# Patient Record
Sex: Male | Born: 1990 | Race: White | Hispanic: No | Marital: Single | Smoking: Current some day smoker
Health system: Southern US, Community
[De-identification: ages and names within clinical notes are randomized; demographics above are authoritative.]

## PROBLEM LIST (undated history)

## (undated) DIAGNOSIS — K219 Gastro-esophageal reflux disease without esophagitis: Secondary | ICD-10-CM

## (undated) DIAGNOSIS — B9562 Methicillin resistant Staphylococcus aureus infection as the cause of diseases classified elsewhere: Secondary | ICD-10-CM

## (undated) HISTORY — PX: FRACTURE SURGERY: SHX138

## (undated) HISTORY — PX: KNEE SURGERY: SHX244

---
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
## 2010-08-06 ENCOUNTER — Emergency Department: Payer: Self-pay | Admitting: Emergency Medicine

## 2010-11-28 ENCOUNTER — Emergency Department: Payer: Self-pay | Admitting: Emergency Medicine

## 2011-01-26 ENCOUNTER — Emergency Department: Payer: Self-pay | Admitting: Emergency Medicine

## 2012-07-10 ENCOUNTER — Emergency Department: Payer: Self-pay | Admitting: Emergency Medicine

## 2012-07-10 LAB — BASIC METABOLIC PANEL
Anion Gap: 10 (ref 7–16)
Chloride: 108 mmol/L — ABNORMAL HIGH (ref 98–107)
Creatinine: 0.83 mg/dL (ref 0.60–1.30)
EGFR (Non-African Amer.): >60
Osmolality: 280 (ref 275–301)

## 2012-07-10 LAB — CBC
HCT: 46 % (ref 40.0–52.0)
MCH: 31.1 pg (ref 26.0–34.0)
MCHC: 34.3 g/dL (ref 32.0–36.0)
MCV: 91 fL (ref 80–100)
Platelet: 219 10*3/uL (ref 150–440)
RDW: 13.1 % (ref 11.5–14.5)
WBC: 14.2 10*3/uL — ABNORMAL HIGH (ref 3.8–10.6)

## 2012-07-10 LAB — ETHANOL
Ethanol %: 0.107 % — ABNORMAL HIGH (ref 0.000–0.080)
Ethanol: 107 mg/dL

## 2012-07-10 LAB — CK TOTAL AND CKMB (NOT AT ARMC): CK, Total: 328 U/L — ABNORMAL HIGH (ref 35–232)

## 2012-10-31 ENCOUNTER — Emergency Department: Payer: Self-pay | Admitting: Emergency Medicine

## 2013-03-06 ENCOUNTER — Emergency Department: Payer: Self-pay | Admitting: Emergency Medicine

## 2013-07-10 IMAGING — CR RIGHT GREAT TOE
1 series · 3 of 3 positions shown · non-contrast
Comparison: none

REASON FOR EXAM: injury
COMMENTS:   LMP: (Male)

[Series 1: x toes ap right · 0.14mm/px · 3 of 3 slices shown]
[im 1/3]
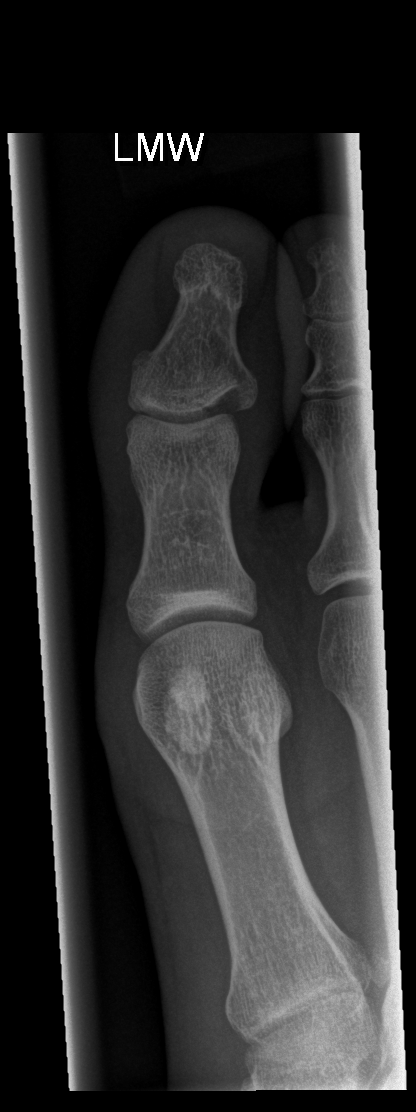
[im 2/3]
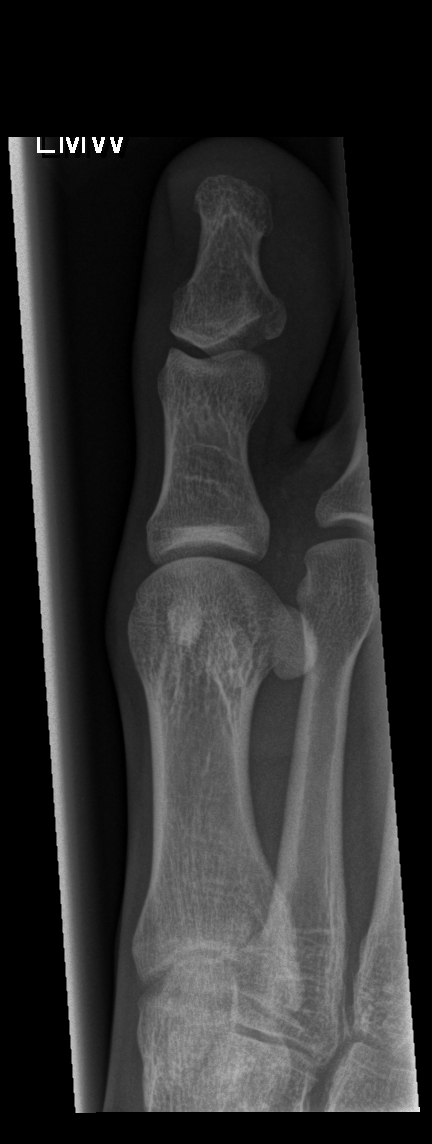
[im 3/3]
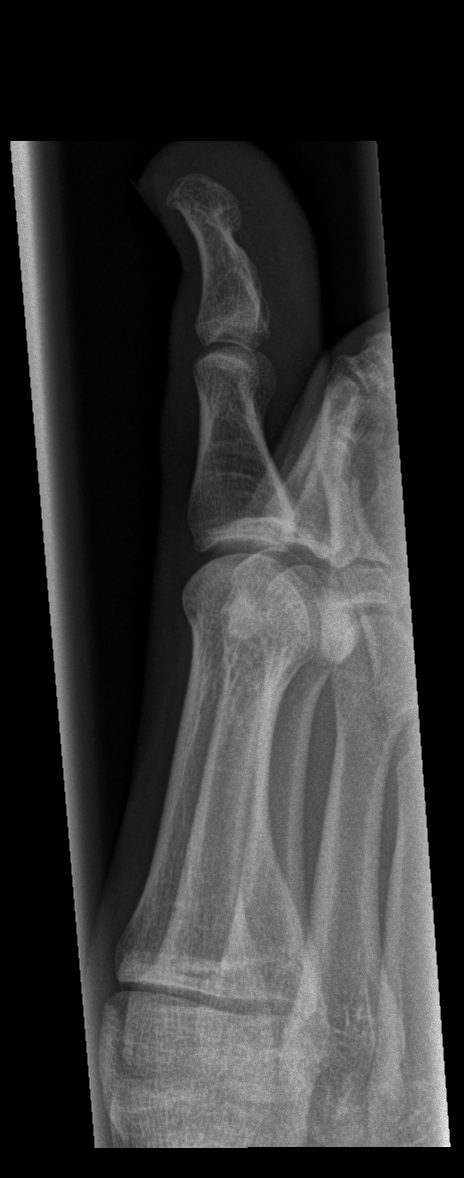

[3 of 3 positions shown; findings below may reference images not displayed]

PROCEDURE:     DXR - DXR TOE GREAT (1ST DIGIT) RT MUNAWARA  - July 10, 2012 [DATE]

RESULT:     Images of the right great toe show deformity at the lateral base
of the distal phalanx. Findings are concerning for a nondisplaced fracture.
The proximal phalanx appears unremarkable. The first metatarsal head and
neck appear to be within normal limits.
IMPRESSION: Please see above.

[REDACTED]

## 2018-01-04 ENCOUNTER — Emergency Department
Admission: EM | Admit: 2018-01-04 | Discharge: 2018-01-04 | Disposition: A | Discharge status: Home / Self Care | Payer: Self-pay | Attending: Emergency Medicine | Admitting: Emergency Medicine

## 2018-01-04 ENCOUNTER — Other Ambulatory Visit: Payer: Self-pay

## 2018-01-04 DIAGNOSIS — F17200 Nicotine dependence, unspecified, uncomplicated: Secondary | ICD-10-CM | POA: Insufficient documentation

## 2018-01-04 DIAGNOSIS — K047 Periapical abscess without sinus: Secondary | ICD-10-CM | POA: Insufficient documentation

## 2018-01-04 MED ORDER — AMOXICILLIN 500 MG PO CAPS: 500.0000 mg | ORAL_CAPSULE | Freq: Three times a day (TID) | ORAL | 0 refills | Status: DC

## 2018-01-04 MED ORDER — IBUPROFEN 600 MG PO TABS: 600.0000 mg | ORAL_TABLET | Freq: Four times a day (QID) | ORAL | 0 refills | Status: DC | PRN

## 2018-01-04 MED ORDER — LIDOCAINE VISCOUS HCL 2 % MT SOLN: 10.0000 mL | OROMUCOSAL | 0 refills | Status: DC | PRN

## 2018-01-04 NOTE — Discharge Instructions (Signed)
OPTIONS FOR DENTAL FOLLOW UP CARE ° °[place] [facility] and [facility] - Local Safety Net Dental Clinics °http://www.ncdhhs.gov/dph/oralhealth/services/safetynetclinics.htm °  °[facility] (336-562-3123) ° °[facility] [place] ([phone]) ° °[facility] ([phone] ext [phone]) ° °[facility] ([phone]) ° °[facility] ([phone]) °This clinic caters to the indigent population and is on a lottery system. °Location: °[facility], [address], [place] °Clinic Hours: °Wednesdays from 6pm - 9pm, patients seen by a lottery system. °For dates, call or go to [email or web address].[facility].edu/[facility]/patients/Dental-[facility] °Services: °Cleanings, fillings and simple extractions. °Payment Options: °DENTAL WORK IS FREE OF CHARGE. Bring proof of income or support. °Best way to get seen: °Arrive at 5:15 pm - this is a lottery, NOT first come/first serve, so arriving earlier will not increase your chances of being seen. °  °  °[facility] °[phone] °Select option 1 for emergencies °  °Location: °[facility], [address], [place] °Clinic Hours: °No walk-ins accepted - call the day before to schedule an appointment. °Check in times are 9:30 am and 1:30 pm. °Services: °Simple extractions, temporary fillings, pulpectomy/pulp debridement, uncomplicated abscess drainage. °Payment Options: °PAYMENT IS DUE AT THE TIME OF SERVICE.  Fee is usually $100-200, additional surgical procedures (e.g. abscess drainage) may be extra. °Cash, checks, Visa/MasterCard accepted.  Can file Medicaid if patient is covered for dental - patient should call case worker to check. °No discount for [facility] patients. °Best way to get seen: °MUST call the day before and get onto the schedule. Can usually be seen the next 1-2 days. No walk-ins accepted. °  °  °[facility] °[phone] °   °Location: °[facility], [address], [place] °Clinic Hours: °M, W, Th, F 8am or 1:30pm, Tues 9a or 1:30 - first come/first served. °Services: °Simple extractions, temporary fillings, uncomplicated abscess drainage.  You do not need to be an [place] resident. °Payment Options: °PAYMENT IS DUE AT THE TIME OF SERVICE. °Dental insurance, otherwise sliding scale - bring proof of income or support. °Depending on income and treatment needed, cost is usually $50-200. °Best way to get seen: °Arrive early as it is first come/first served. °  °  °[facility] °[phone] °  °Location: °[address] °Clinic Hours: °Mon-Thu 8a-5p °Services: °Most basic dental services including extractions and fillings. °Payment Options: °PAYMENT IS DUE AT THE TIME OF SERVICE. °Sliding scale, up to 50% off - bring proof if income or support. °Medicaid with dental option accepted. °Best way to get seen: °Call to schedule an appointment, can usually be seen within 2 weeks OR they will try to see walk-ins - show up at 8a or 2p (you may have to wait). °  °  °[facility] °[phone] °[place] RESIDENTS ONLY °  °Location: °[facility], [address], [place], [place] [address] °Clinic Hours: By appointment only. °Monday - Thursday 8am-5pm, Friday 8am-12pm °Services: Cleanings, fillings, extractions. °Payment Options: °PAYMENT IS DUE AT THE TIME OF SERVICE. °Cash, Visa or MasterCard. Sliding scale - $30 minimum per service. °Best way to get seen: °Come in to office, complete packet and make an appointment - need proof of income °or support monies for each household member and proof of [place] residence. °Usually takes about a month to get in. °  °  °[facility] °[phone] °  °Location: °[address]   [place] °Clinic Hours: Walk-in Urgent Care Dental Services are offered Monday-Friday  mornings only. °The numbers of emergencies accepted daily is limited to the number of °providers available. °Maximum 15 - Mondays, Wednesdays & Thursdays °Maximum 10 - Tuesdays & Fridays °Services: °You do not need to be a [place] resident to be seen for a dental emergency. °Emergencies are defined as pain, swelling, abnormal bleeding, or dental trauma. Walkins will receive x-rays if needed. °NOTE: Dental cleaning is not an emergency. °Payment Options: °PAYMENT IS DUE AT THE TIME OF SERVICE. °Minimum co-pay is $40.00 for uninsured patients. °Minimum co-pay is $3.00 for Medicaid with dental coverage. °Dental Insurance is accepted and must be presented at time of visit. °Medicare does not cover dental. °Forms of payment: Cash, credit card, checks. °Best way to get seen: °If not previously registered with the clinic, walk-in dental registration begins at 7:15 am and is on a first come/first serve basis. °If previously registered with the clinic, call to make an appointment. °  °  °[facility] °[phone] °[place] RESIDENTS ONLY °  °Location: °[address], [place], [place] °Clinic Hours: °Mon-Thu 10a-2p °Services: Extractions only! °Payment Options: °FREE (donations accepted) - bring proof of income or support °Best way to get seen: °Call and schedule an appointment OR come at 8am on the 1st Monday of every month (except for holidays) when it is first come/first served. °  °  °[facility] °[phone] °  °Location: °[address], [place] °Clinic Hours: °Friday mornings °Services, Payment Options, Best way to get seen: °Call for info °

## 2018-01-04 NOTE — ED Provider Notes (Signed)
[facility] Emergency Department Provider Note  ____________________________________________  Time seen: Approximately 9:39 AM  I have reviewed the triage vital signs and the nursing notes.   HISTORY  Chief Complaint Dental Pain    HPI Kurt Santos is a 27 y.o. male that presents to the emergency department for evaluation of right top dental pain for 2 days. This morning right cheek was swollen. He could taste blood this morning. He chipped tooth 8 months ago and saw the dentist and needs a procedure but insurance would not pay for it.  Hes tried ibuprofen, goody powders, orajel. No fever, chills.   History reviewed. No pertinent past medical history.  There are no active problems to display for this patient.   Past Surgical History:  Procedure Laterality Date  . FRACTURE SURGERY      Prior to Admission medications   Medication Sig Start Date End Date Taking? Authorizing Provider  amoxicillin (AMOXIL) 500 MG capsule Take 1 capsule (500 mg total) by mouth 3 (three) times daily. 01/04/18   Enid Derry, PA-C  ibuprofen (ADVIL,MOTRIN) 600 MG tablet Take 1 tablet (600 mg total) by mouth every 6 (six) hours as needed. 01/04/18   Enid Derry, PA-C  lidocaine (XYLOCAINE) 2 % solution Use as directed 10 mLs in the mouth or throat as needed for mouth pain. 01/04/18   Enid Derry, PA-C    Allergies Patient has no known allergies.  No family history on file.  Social History Social History   Tobacco Use  . Smoking status: Current Every Day Smoker  . Smokeless tobacco: Never Used  Substance Use Topics  . Alcohol use: Yes  . Drug use: Not on file     Review of Systems  Constitutional: No fever/chills ENT: No upper respiratory complaints. Gastrointestinal: No nausea, no vomiting.  Musculoskeletal: Negative for musculoskeletal pain. Skin: Negative for rash, abrasions, lacerations, ecchymosis. Neurological: Negative for  headaches   ____________________________________________   PHYSICAL EXAM:  VITAL SIGNS: ED Triage Vitals  Enc Vitals Group     BP 01/04/18 0830 (!) 116/57     Pulse Rate 01/04/18 0830 (!) 50     Resp 01/04/18 0830 16     Temp 01/04/18 0830 98.7 F (37.1 C)     Temp Source 01/04/18 0830 Oral     SpO2 01/04/18 0830 98 %     Weight 01/04/18 0826 130 lb (59 kg)     Height 01/04/18 0826 5\' 8"  (1.727 m)     Head Circumference --      Peak Flow --      Pain Score 01/04/18 0826 8     Pain Loc --      Pain Edu? --      Excl. in GC? --      Constitutional: Alert and oriented. Well appearing and in no acute distress. Eyes: Conjunctivae are normal. PERRL. EOMI. Head: Atraumatic. ENT:      Ears:      Nose: No congestion/rhinnorhea.      Mouth/Throat: Mucous membranes are moist. Large fracture and cavity to tooth # 1. Swelling to right cheek. No drainable abscess. No difficulty opening or closing mouth.   Neck: No stridor.  Cardiovascular: Good peripheral circulation. Respiratory: Normal respiratory effort without tachypnea or retractions. Musculoskeletal: Full range of motion to all extremities. No gross deformities appreciated. Neurologic:  Normal speech and language. No gross focal neurologic deficits are appreciated.  Skin:  Skin is warm, dry and intact. No rash noted.  Psychiatric: Mood and affect are normal. Speech and behavior are normal. Patient exhibits appropriate insight and judgement.   ____________________________________________   LABS (all labs ordered are listed, but only abnormal results are displayed)  Labs Reviewed - No data to display ____________________________________________  EKG   ____________________________________________  RADIOLOGY  No results found.  ____________________________________________    PROCEDURES  Procedure(s) performed:    Procedures    Medications - No data to  display   ____________________________________________   INITIAL IMPRESSION / ASSESSMENT AND PLAN / ED COURSE  Pertinent labs & imaging results that were available during my care of the patient were reviewed by me and considered in my medical decision making (see chart for details).  Review of the [place] CSRS was performed in accordance of the [facility] prior to dispensing any controlled drugs.     Patient's diagnosis is consistent with dental abscess. Patient will be discharged home with prescriptions for amoxicillin, viscous lidocaine. Patient is to follow up with dentist as directed. Dental resources were given. Patient is given ED precautions to return to the ED for any worsening or new symptoms.     ____________________________________________  FINAL CLINICAL IMPRESSION(S) / ED DIAGNOSES  Final diagnoses:  Dental abscess      NEW MEDICATIONS STARTED DURING THIS VISIT:  ED Discharge Orders        Ordered    amoxicillin (AMOXIL) 500 MG capsule  3 times daily     01/04/18 0944    lidocaine (XYLOCAINE) 2 % solution  As needed     01/04/18 0944    ibuprofen (ADVIL,MOTRIN) 600 MG tablet  Every 6 hours PRN     01/04/18 0944          This chart was dictated using voice recognition software/Dragon. Despite best efforts to proofread, errors can occur which can change the meaning. Any change was purely unintentional.    Enid DerryWagner, Ansleigh Safer, PA-C 01/04/18 1150    Pershing ProudSchaevitz, Myra Rudeavid Matthew, MD 01/04/18 [id number]

## 2018-01-04 NOTE — ED Triage Notes (Signed)
Pt c/o right upper tooth pain with mild facial swelling for the past 2 days.

## 2018-01-04 NOTE — ED Notes (Signed)

## 2019-11-30 ENCOUNTER — Encounter: Payer: Self-pay | Admitting: Emergency Medicine

## 2019-11-30 ENCOUNTER — Other Ambulatory Visit: Payer: Self-pay

## 2019-11-30 ENCOUNTER — Emergency Department
Admission: EM | Admit: 2019-11-30 | Discharge: 2019-11-30 | Disposition: A | Discharge status: Home / Self Care | Payer: Self-pay | Attending: Emergency Medicine | Admitting: Emergency Medicine

## 2019-11-30 DIAGNOSIS — W57XXXA Bitten or stung by nonvenomous insect and other nonvenomous arthropods, initial encounter: Secondary | ICD-10-CM | POA: Insufficient documentation

## 2019-11-30 DIAGNOSIS — F17210 Nicotine dependence, cigarettes, uncomplicated: Secondary | ICD-10-CM | POA: Insufficient documentation

## 2019-11-30 DIAGNOSIS — Y929 Unspecified place or not applicable: Secondary | ICD-10-CM | POA: Insufficient documentation

## 2019-11-30 DIAGNOSIS — S60561A Insect bite (nonvenomous) of right hand, initial encounter: Secondary | ICD-10-CM | POA: Insufficient documentation

## 2019-11-30 DIAGNOSIS — Y939 Activity, unspecified: Secondary | ICD-10-CM | POA: Insufficient documentation

## 2019-11-30 DIAGNOSIS — Y999 Unspecified external cause status: Secondary | ICD-10-CM | POA: Insufficient documentation

## 2019-11-30 MED ORDER — SULFAMETHOXAZOLE-TRIMETHOPRIM 800-160 MG PO TABS
1.0000 | ORAL_TABLET | Freq: Once | ORAL | Status: AC
  Administered 2019-11-30: 1 via ORAL
  Filled 2019-11-30: qty 1

## 2019-11-30 MED ORDER — SULFAMETHOXAZOLE-TRIMETHOPRIM 800-160 MG PO TABS: 1.0000 | ORAL_TABLET | Freq: Two times a day (BID) | ORAL | 0 refills | Status: DC

## 2019-11-30 MED ORDER — NAPROXEN 500 MG PO TABS: 500.0000 mg | ORAL_TABLET | Freq: Two times a day (BID) | ORAL | 0 refills | Status: DC

## 2019-11-30 MED ORDER — NAPROXEN 500 MG PO TABS
500.0000 mg | ORAL_TABLET | Freq: Once | ORAL | Status: AC
  Administered 2019-11-30: 500 mg via ORAL
  Filled 2019-11-30: qty 1

## 2019-11-30 NOTE — ED Provider Notes (Signed)
[facility] Emergency Department Provider Note   ____________________________________________   First MD Initiated Contact with Patient 11/30/19 1127     (approximate)  I have reviewed the triage vital signs and the nursing notes.   HISTORY  Chief Complaint Insect Bite    HPI Kurt Santos is a 29 y.o. male patient presents with insect bite to the right hand 2 days ago.  Patient states increased redness and pain today.  Patient denies loss sensation but states decreased grip strength.  Patient rates the pain as 8/10.  Patient described the pain is "achy".  No palliative measure for complaint.         History reviewed. No pertinent past medical history.  There are no problems to display for this patient.   Past Surgical History:  Procedure Laterality Date  . FRACTURE SURGERY      Prior to Admission medications   Medication Sig Start Date End Date Taking? Authorizing Provider  naproxen (NAPROSYN) 500 MG tablet Take 1 tablet (500 mg total) by mouth 2 (two) times daily with a meal. 11/30/19   Joni Reining, PA-C  sulfamethoxazole-trimethoprim (BACTRIM DS) 800-160 MG tablet Take 1 tablet by mouth 2 (two) times daily. 11/30/19   Joni Reining, PA-C    Allergies Patient has no known allergies.  No family history on file.  Social History Social History   Tobacco Use  . Smoking status: Current Every Day Smoker  . Smokeless tobacco: Never Used  Substance Use Topics  . Alcohol use: Yes  . Drug use: Not on file    Review of Systems Constitutional: No fever/chills Eyes: No visual changes. ENT: No sore throat. Cardiovascular: Denies chest pain. Respiratory: Denies shortness of breath. Gastrointestinal: No abdominal pain.  No nausea, no vomiting.  No diarrhea.  No constipation. Genitourinary: Negative for dysuria. Musculoskeletal: Negative for back pain. Skin: Negative for rash.  Redness and swelling dorsal aspect of the right  hand. Neurological: Negative for headaches, focal weakness or numbness.   ____________________________________________   PHYSICAL EXAM:  VITAL SIGNS: ED Triage Vitals  Enc Vitals Group     BP 11/30/19 1129 130/78     Pulse Rate 11/30/19 1129 88     Resp 11/30/19 1129 18     Temp 11/30/19 1129 98 F (36.7 C)     Temp Source 11/30/19 1129 Oral     SpO2 11/30/19 1129 98 %     Weight 11/30/19 1123 128 lb (58.1 kg)     Height 11/30/19 1123 5\' 8"  (1.727 m)     Head Circumference --      Peak Flow --      Pain Score 11/30/19 1122 8     Pain Loc --      Pain Edu? --      Excl. in GC? --    Constitutional: Alert and oriented. Well appearing and in no acute distress. Hematological/Lymphatic/Immunilogical: No cervical lymphadenopathy. Cardiovascular: Normal rate, regular rhythm. Grossly normal heart sounds.  Good peripheral circulation. Respiratory: Normal respiratory effort.  No retractions. Lungs CTAB. Skin: Edema and erythema dorsal aspect the right hand. Psychiatric: Mood and affect are normal. Speech and behavior are normal.  ____________________________________________   LABS (all labs ordered are listed, but only abnormal results are displayed)  Labs Reviewed - No data to display ____________________________________________  EKG   ____________________________________________  RADIOLOGY  ED MD interpretation:    Official radiology report(s): No results found.  ____________________________________________   PROCEDURES  Procedure(s)  performed (including Critical Care):  Procedures   ____________________________________________   INITIAL IMPRESSION / ASSESSMENT AND PLAN / ED COURSE  As part of my medical decision making, I reviewed the following data within the [facility]     Patient presents with redness and swelling to the right hand secondary to insect bite 2 days ago.  Patient's complaint and physical exam is consistent with  infected insect bite.  Patient given discharge care instruction advised take medication as directed.  Patient advised [facility] care with [facility].    Kurt Santos was evaluated in Emergency Department on 11/30/2019 for the symptoms described in the history of present illness. He was evaluated in the context of the global COVID-19 pandemic, which necessitated consideration that the patient might be at risk for infection with the SARS-CoV-2 virus that causes COVID-19. Institutional protocols and algorithms that pertain to the evaluation of patients at risk for COVID-19 are in a state of rapid change based on information released by regulatory bodies including the CDC and federal and state organizations. These policies and algorithms were followed during the patient's care in the ED.       ____________________________________________   FINAL CLINICAL IMPRESSION(S) / ED DIAGNOSES  Final diagnoses:  Bug bite with infection, initial encounter     ED Discharge Orders         Ordered    sulfamethoxazole-trimethoprim (BACTRIM DS) 800-160 MG tablet  2 times daily     Discontinue  Reprint     11/30/19 1139    naproxen (NAPROSYN) 500 MG tablet  2 times daily with meals     Discontinue  Reprint     11/30/19 1139           Note:  This document was prepared using Dragon voice recognition software and may include unintentional dictation errors.    Sable Feil, PA-C 11/30/19 1144    Arta Silence, MD 11/30/19 1158

## 2019-11-30 NOTE — ED Triage Notes (Signed)
Pt reports spider bite to right hand Sunday. Pt reports thought it would get better but still painful

## 2019-12-02 ENCOUNTER — Ambulatory Visit: Payer: Self-pay | Admitting: *Deleted

## 2019-12-02 NOTE — Telephone Encounter (Signed)
Pt seen in ED 11/30/2019 for spider bite on wrist. Given antibiotics, day 2 of starting course. States "Just doesn't look any better." Some increased tenderness, "Bite itself looks bigger and hardened area." No increase in swelling, no change in warmth of area or redness surrounding bite. .  Advised to give antibiotics a few more days, advised ED for any increase redness, warmth, pain, severe headache or fever.Pt applying ice. Pt verbalizes understanding.   Reason for Disposition . Painful insect bite  Answer Assessment - Initial Assessment Questions 1. TYPE of INSECT: "What type of insect was it?"      Spider 2. ONSET: "When did you get bitten?"       3. LOCATION: "Where is the insect bite located?"       4. REDNESS: "Is the area red or pink?" If so, ask "What size is area of redness?" (inches or cm). "When did the redness start?"     *No Answer* 5. PAIN: "Is there any pain?" If so, ask: "How bad is it?"  (Scale 1-10; or mild, moderate, severe)     *No Answer* 6. ITCHING: "Does it itch?" If so, ask: "How bad is the itch?"    - MILD: doesn't interfere with normal activities   - MODERATE-SEVERE: interferes with work, school, sleep, or other activities      *No Answer* 7. SWELLING: "How big is the swelling?" (inches, cm, or compare to coins)     *No Answer* 8. OTHER SYMPTOMS: "Do you have any other symptoms?"  (e.g., difficulty breathing, hives)     no  Protocols used: INSECT BITE-A-AH

## 2019-12-03 ENCOUNTER — Other Ambulatory Visit: Payer: Self-pay

## 2019-12-03 ENCOUNTER — Emergency Department: Payer: Self-pay

## 2019-12-03 ENCOUNTER — Emergency Department
Admission: EM | Admit: 2019-12-03 | Discharge: 2019-12-03 | Disposition: A | Discharge status: Home / Self Care | Payer: Self-pay | Attending: Emergency Medicine | Admitting: Emergency Medicine

## 2019-12-03 DIAGNOSIS — M71031 Abscess of bursa, right wrist: Secondary | ICD-10-CM

## 2019-12-03 DIAGNOSIS — L02413 Cutaneous abscess of right upper limb: Secondary | ICD-10-CM | POA: Insufficient documentation

## 2019-12-03 DIAGNOSIS — F17210 Nicotine dependence, cigarettes, uncomplicated: Secondary | ICD-10-CM | POA: Insufficient documentation

## 2019-12-03 DIAGNOSIS — W57XXXD Bitten or stung by nonvenomous insect and other nonvenomous arthropods, subsequent encounter: Secondary | ICD-10-CM | POA: Insufficient documentation

## 2019-12-03 LAB — CBC
HCT: 39.6 % (ref 39.0–52.0)
Hemoglobin: 13.6 g/dL (ref 13.0–17.0)
MCH: 30.8 pg (ref 26.0–34.0)
MCHC: 34.3 g/dL (ref 30.0–36.0)
MCV: 89.6 fL (ref 80.0–100.0)
Platelets: 202 10*3/uL (ref 150–400)
RBC: 4.42 MIL/uL (ref 4.22–5.81)
RDW: 12.7 % (ref 11.5–15.5)
WBC: 12.7 10*3/uL — ABNORMAL HIGH (ref 4.0–10.5)
nRBC: 0 % (ref 0.0–0.2)

## 2019-12-03 LAB — COMPREHENSIVE METABOLIC PANEL
ALT: 23 U/L (ref 0–44)
AST: 25 U/L (ref 15–41)
Albumin: 4.5 g/dL (ref 3.5–5.0)
Alkaline Phosphatase: 57 U/L (ref 38–126)
Anion gap: 6 (ref 5–15)
BUN: 12 mg/dL (ref 6–20)
CO2: 25 mmol/L (ref 22–32)
Calcium: 8.9 mg/dL (ref 8.9–10.3)
Chloride: 107 mmol/L (ref 98–111)
Creatinine, Ser: 1.24 mg/dL (ref 0.61–1.24)
GFR calc Af Amer: >60 mL/min (ref >60)
GFR calc non Af Amer: >60 mL/min (ref >60)
Glucose, Bld: 104 mg/dL — ABNORMAL HIGH (ref 70–99)
Potassium: 3.9 mmol/L (ref 3.5–5.1)
Sodium: 138 mmol/L (ref 135–145)
Total Bilirubin: 1 mg/dL (ref 0.3–1.2)
Total Protein: 7.6 g/dL (ref 6.5–8.1)

## 2019-12-03 MED ORDER — HYDROCODONE-ACETAMINOPHEN 5-325 MG PO TABS: 1.0000 | ORAL_TABLET | Freq: Four times a day (QID) | ORAL | 0 refills | Status: DC | PRN

## 2019-12-03 MED ORDER — CEPHALEXIN 500 MG PO CAPS: 500.0000 mg | ORAL_CAPSULE | Freq: Three times a day (TID) | ORAL | 0 refills | Status: DC

## 2019-12-03 MED ORDER — LIDOCAINE-EPINEPHRINE-TETRACAINE (LET) TOPICAL GEL
3.0000 mL | Freq: Once | TOPICAL | Status: AC
  Administered 2019-12-03: 3 mL via TOPICAL
  Filled 2019-12-03: qty 3

## 2019-12-03 MED ORDER — OXYCODONE-ACETAMINOPHEN 7.5-325 MG PO TABS
1.0000 | ORAL_TABLET | Freq: Once | ORAL | Status: AC
  Administered 2019-12-03: 1 via ORAL
  Filled 2019-12-03: qty 1

## 2019-12-03 MED ORDER — LIDOCAINE HCL (PF) 1 % IJ SOLN
5.0000 mL | Freq: Once | INTRAMUSCULAR | Status: AC
  Administered 2019-12-03: 5 mL
  Filled 2019-12-03: qty 5

## 2019-12-03 NOTE — Discharge Instructions (Signed)
Continue taking the antibiotic that you already have until completely finished.  A new antibiotic was sent to your pharmacy which should be taken at the same time 3 times a day for the next 7 days.  Also a prescription for pain medication was sent to your pharmacy to take as needed every 6 hours.  Also elevating your hand will help with pain.  You may return to the emergency department in 2 days if you would like for [place] to take the drain out.  Most likely will fall out on its own when you take the dressing off in 2 days.  If you see that it is worsening or not improving return to the emergency department.

## 2019-12-03 NOTE — ED Triage Notes (Signed)
Pt was bit by spider Saturday and started having redness and itching around the site. Pt was seen for the same and put on antibiotics for cellulitis. States woke up tonight with worsening swelling and redness. Area is to right wrist with swelling extending to hand.

## 2019-12-03 NOTE — ED Provider Notes (Signed)
[facility] Emergency Department Provider Note   ____________________________________________   First MD Initiated Contact with Patient 12/03/19 1057     (approximate)  I have reviewed the triage vital signs and the nursing notes.   HISTORY  Chief Complaint Cellulitis   HPI Kurt Santos is a 29 y.o. male Libby Maw to the ED with complaint of continued pain and swelling around his spider bite on his right wrist.  Patient was seen in the ED for the same on 11/30/2019.  Patient states that while he was working he was also clearing a "nest of spiders".  He was not aware that he had been bitten.  Patient has been taking the Bactrim DS as directed.  He states that it is slowly become more red and more painful in the last 1 to 2 days.  He denies any fever or chills.  There has been no nausea or vomiting.  He rates his pain as an 8 out of 10.       No past medical history on file.  There are no problems to display for this patient.   Past Surgical History:  Procedure Laterality Date  . FRACTURE SURGERY      Prior to Admission medications   Medication Sig Start Date End Date Taking? Authorizing Provider  cephALEXin (KEFLEX) 500 MG capsule Take 1 capsule (500 mg total) by mouth 3 (three) times daily. 12/03/19   Johnn Hai, PA-C  HYDROcodone-acetaminophen (NORCO/VICODIN) 5-325 MG tablet Take 1 tablet by mouth every 6 (six) hours as needed for moderate pain. 12/03/19   Johnn Hai, PA-C  naproxen (NAPROSYN) 500 MG tablet Take 1 tablet (500 mg total) by mouth 2 (two) times daily with a meal. 11/30/19   Sable Feil, PA-C  sulfamethoxazole-trimethoprim (BACTRIM DS) 800-160 MG tablet Take 1 tablet by mouth 2 (two) times daily. 11/30/19   Sable Feil, PA-C    Allergies Patient has no known allergies.  No family history on file.  Social History Social History   Tobacco Use  . Smoking status: Current Every Day Smoker  . Smokeless tobacco:  Never Used  Substance Use Topics  . Alcohol use: Yes  . Drug use: Not on file    Review of Systems Constitutional: No fever/chills Cardiovascular: Denies chest pain. Respiratory: Denies shortness of breath. Gastrointestinal: No abdominal pain.  No nausea, no vomiting.  Musculoskeletal: Positive right wrist pain. Skin: Positive abscess right wrist. Neurological: Negative for headaches, focal weakness or numbness.  ____________________________________________   PHYSICAL EXAM:  VITAL SIGNS: ED Triage Vitals  Enc Vitals Group     BP 12/03/19 0221 (!) 156/88     Pulse Rate 12/03/19 0221 86     Resp 12/03/19 0221 20     Temp 12/03/19 0221 98.8 F (37.1 C)     Temp Source 12/03/19 0221 Oral     SpO2 12/03/19 0221 100 %     Weight 12/03/19 0222 128 lb (58.1 kg)     Height 12/03/19 0222 5\' 8"  (1.727 m)     Head Circumference --      Peak Flow --      Pain Score 12/03/19 0222 8     Pain Loc --      Pain Edu? --      Excl. in [place]? --     Constitutional: Alert and oriented. Well appearing and in no acute distress. Eyes: Conjunctivae are normal.  Head: Atraumatic. Neck: No stridor.   Cardiovascular:  Normal rate, regular rhythm. Grossly normal heart sounds.  Good peripheral circulation. Respiratory: Normal respiratory effort.  No retractions. Lungs CTAB. Musculoskeletal: On examination of the the right wrist ulnar aspect there is a large erythematous area that is fluctuant and warm to touch.  Range of motion is slow but patient is able to flex and extend.  He also is able to move digits slowly secondary to pain.  Motor sensory function intact and capillary refills less than 3 seconds. Neurologic:  Normal speech and language. No gross focal neurologic deficits are appreciated. No gait instability. Skin:  Skin is warm, dry.  Abscess is noted above. Psychiatric: Mood and affect are normal. Speech and behavior are normal.  ____________________________________________   LABS (all  labs ordered are listed, but only abnormal results are displayed)  Labs Reviewed  CBC - Abnormal; Notable for the following components:      Result Value   WBC 12.7 (*)    All other components within normal limits  COMPREHENSIVE METABOLIC PANEL - Abnormal; Notable for the following components:   Glucose, Bld 104 (*)    All other components within normal limits   _  RADIOLOGY   Official radiology report(s): DG Wrist Complete Right  Result Date: 12/03/2019 CLINICAL DATA:  Infection EXAM: RIGHT WRIST - COMPLETE 3+ VIEW COMPARISON:  08/06/2010 FINDINGS: Soft tissue swelling along the ulnar side of the wrist. No radiopaque foreign bodies. No acute bony abnormality. Specifically, no fracture, subluxation, or dislocation. No evidence of osteomyelitis. IMPRESSION: Soft tissue swelling.  No bony abnormality. Electronically Signed   By: Charlett Nose M.D.   On: 12/03/2019 02:50    ____________________________________________   PROCEDURES  Procedure(s) performed (including Critical Care):  Marland KitchenMarland KitchenIncision and Drainage  Date/Time: 12/03/2019 11:50 AM Performed by: Tommi Rumps, PA-C Authorized by: Tommi Rumps, PA-C   Consent:    Consent obtained:  Verbal   Consent given by:  Patient   Risks discussed:  Incomplete drainage and infection Location:    Type:  Abscess   Location:  Upper extremity   Upper extremity location:  Wrist   Wrist location:  R wrist Pre-procedure details:    Skin preparation:  Antiseptic wash Anesthesia (see MAR for exact dosages):    Anesthesia method:  Topical application and local infiltration   Topical anesthetic:  LET   Local anesthetic:  Lidocaine 1% w/o epi Procedure type:    Complexity:  Simple Procedure details:    Needle aspiration: no     Incision types:  Stab incision   Incision depth:  Dermal   Scalpel blade:  11   Wound management:  Probed and deloculated and irrigated with saline   Drainage:  Purulent   Drainage amount:  Moderate    Wound treatment:  Drain placed   Packing materials:  1/4 in iodoform gauze Post-procedure details:    Patient tolerance of procedure:  Tolerated well, no immediate complications     ____________________________________________   INITIAL IMPRESSION / ASSESSMENT AND PLAN / ED COURSE  As part of my medical decision making, I reviewed the following data within the electronic MEDICAL RECORD NUMBER Notes from prior ED visits and [place] Controlled Substance Database  29 year old male presents to the ED with possible spider bite to his wrist.  He was seen in the emergency department 3 days ago at which time he was placed on Bactrim DS and he continues to take.  X-ray was negative for signs of osteomyelitis.  Patient tolerated procedure well and for the  size of the area there was moderate amount of purulent drainage.  A iodoform gauze packing was placed.  Patient is also to start Keflex 500 mg 3 times daily for the next 7 days and a prescription for pain medication was sent to his pharmacy.  He was told to return to the emergency department in 2 days if he is unable or has not seen the packing fall out on its own.  Also he is aware that if there is continued redness and not improving that he will field outpatient antibiotics with the I&D done today as where most likely he will be hospitalized for IV antibiotics.  He is encouraged to return to the emergency department over the weekend if any problems.  ____________________________________________   FINAL CLINICAL IMPRESSION(S) / ED DIAGNOSES  Final diagnoses:  Abscess of bursa of right wrist     ED Discharge Orders         Ordered    cephALEXin (KEFLEX) 500 MG capsule  3 times daily     Discontinue  Reprint     12/03/19 1240    HYDROcodone-acetaminophen (NORCO/VICODIN) 5-325 MG tablet  Every 6 hours PRN     Discontinue  Reprint     12/03/19 1240           Note:  This document was prepared using Dragon voice recognition software and may include  unintentional dictation errors.    Tommi Rumps, PA-C 12/03/19 1605    Shaune Pollack, MD 12/06/19 [phone]

## 2019-12-07 ENCOUNTER — Other Ambulatory Visit: Payer: Self-pay

## 2019-12-07 ENCOUNTER — Ambulatory Visit
Admission: RE | Admit: 2019-12-07 | Discharge: 2019-12-07 | Disposition: A | Discharge status: Home / Self Care | Payer: Self-pay | Source: Ambulatory Visit | Attending: Family Medicine | Admitting: Family Medicine

## 2019-12-07 VITALS — BP 103/59 | HR 58 | Temp 98.4°F | Resp 18 | Ht 68.0 in | Wt 128.1 lb

## 2019-12-07 DIAGNOSIS — L02419 Cutaneous abscess of limb, unspecified: Secondary | ICD-10-CM

## 2019-12-07 MED ORDER — DOXYCYCLINE HYCLATE 100 MG PO CAPS: 100.0000 mg | ORAL_CAPSULE | Freq: Two times a day (BID) | ORAL | 0 refills | Status: DC

## 2019-12-07 NOTE — Discharge Instructions (Signed)
Keep area clean.  Stop Bactrim and Keflex. Start Doxycycline.  Take care  Dr. Adriana Simas

## 2019-12-07 NOTE — ED Provider Notes (Signed)
[facility]    CSN: [id number] Arrival date & time: 12/07/19  1053 History   Chief Complaint Chief Complaint  Patient presents with  . Abscess  . work note   HPI  29 year old male presents with the above complaints.  Patient has had 2 visits to [facility] last week. Reviewed today.  6/15 - Reported insect bite.  Was evaluated and thought to be infected.  Was placed on Bactrim and naproxen.  6/18 - Presented with worsening.  Incision and drainage was performed.  Discharged on Keflex in addition to Bactrim.  Patient presents today reporting that he is unable to work due to his abscess (Location: Right wrist). He endorses compliance with his medications.  Rates his pain a 7/10 in severity.  No fever.  Patient requesting FMLA paperwork to be filled out today.  No other associated symptoms.  No other complaints.  Past Surgical History:  Procedure Laterality Date  . FRACTURE SURGERY     Home Medications    Prior to Admission medications   Medication Sig Start Date End Date Taking? Authorizing Provider  HYDROcodone-acetaminophen (NORCO/VICODIN) 5-325 MG tablet Take 1 tablet by mouth every 6 (six) hours as needed for moderate pain. 12/03/19  Yes Tommi Rumps, PA-C  naproxen (NAPROSYN) 500 MG tablet Take 1 tablet (500 mg total) by mouth 2 (two) times daily with a meal. 11/30/19  Yes Joni Reining, PA-C  doxycycline (VIBRAMYCIN) 100 MG capsule Take 1 capsule (100 mg total) by mouth 2 (two) times daily. 12/07/19   Tommie Sams, DO    Family History Family History  Problem Relation Age of Onset  . Healthy Mother   . Healthy Father     Social History Social History   Tobacco Use  . Smoking status: Current Every Day Smoker    Types: Cigarettes  . Smokeless tobacco: Never Used  Vaping Use  . Vaping Use: Never used  Substance Use Topics  . Alcohol use: Yes  . Drug use: Not Currently     Allergies   Patient has no known allergies.   Review of  Systems Review of Systems  Constitutional: Negative for fever.  Skin: Positive for wound.   Physical Exam Triage Vital Signs ED Triage Vitals  Enc Vitals Group     BP 12/07/19 1109 (!) 103/59     Pulse Rate 12/07/19 1109 (!) 58     Resp 12/07/19 1109 18     Temp 12/07/19 1109 98.4 F (36.9 C)     Temp Source 12/07/19 1109 Oral     SpO2 12/07/19 1109 98 %     Weight 12/07/19 1106 128 lb 1.4 oz (58.1 kg)     Height 12/07/19 1106 5\' 8"  (1.727 m)     Head Circumference --      Peak Flow --      Pain Score 12/07/19 1106 7     Pain Loc --      Pain Edu? --      Excl. in [place]? --    Updated Vital Signs BP (!) 103/59 (BP Location: Left Arm)   Pulse (!) 58   Temp 98.4 F (36.9 C) (Oral)   Resp 18   Ht 5\' 8"  (1.727 m)   Wt 58.1 kg   SpO2 98%   BMI 19.48 kg/m   Visual Acuity Right Eye Distance:   Left Eye Distance:   Bilateral Distance:    Right Eye Near:   Left Eye Near:  Bilateral Near:     Physical Exam Vitals and nursing note reviewed.  Constitutional:      Appearance: Normal appearance.  HENT:     Head: Normocephalic and atraumatic.  Eyes:     General:        Right eye: No discharge.        Left eye: No discharge.     Conjunctiva/sclera: Conjunctivae normal.  Pulmonary:     Effort: Pulmonary effort is normal. No respiratory distress.  Skin:    Comments: Right wrist -ulnar aspect with an open wound from recent incision and drainage.  Purulence noted.  Surrounding erythema and mild warmth noted as well.  Neurological:     Mental Status: He is alert.  Psychiatric:        Mood and Affect: Mood normal.        Behavior: Behavior normal.    UC Treatments / Results  Labs (all labs ordered are listed, but only abnormal results are displayed) Labs Reviewed - No data to display  EKG   Radiology No results found.  Procedures Procedures (including critical care time)  Medications Ordered in UC Medications - No data to display  Initial Impression /  Assessment and Plan / UC Course  I have reviewed the triage vital signs and the nursing notes.  Pertinent labs & imaging results that were available during my care of the patient were reviewed by me and considered in my medical decision making (see chart for details).    29 year old male presents with an abscess.  Has already been seen previously and has had incision and drainage.  Given his current clinical exam and the fact that he does not seem to be resolving, I am stopping his Bactrim and Keflex and placing him on doxycycline.  Work note given.  Final Clinical Impressions(s) / UC Diagnoses   Final diagnoses:  Abscess, wrist     Discharge Instructions     Keep area clean.  Stop Bactrim and Keflex. Start Doxycycline.  Take care  Dr. Lacinda Axon    ED Prescriptions    Medication Sig Dispense Auth. Provider   doxycycline (VIBRAMYCIN) 100 MG capsule Take 1 capsule (100 mg total) by mouth 2 (two) times daily. 20 capsule Coral Spikes, DO     PDMP not reviewed this encounter.   Coral Spikes, [place] 12/07/19 1323

## 2019-12-07 NOTE — ED Triage Notes (Addendum)
Pt c/o right wrist abscess from spider bite. He was seen at the ED on 12/03/19 and treated. 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

## 2019-12-07 NOTE — ED Notes (Signed)
Cover wound with non stick gauze and coban

## 2020-04-13 ENCOUNTER — Other Ambulatory Visit: Payer: Self-pay

## 2020-04-13 ENCOUNTER — Encounter: Payer: Self-pay | Admitting: Emergency Medicine

## 2020-04-13 ENCOUNTER — Ambulatory Visit
Admission: EM | Admit: 2020-04-13 | Discharge: 2020-04-13 | Disposition: A | Discharge status: Home / Self Care | Payer: BC Managed Care – PPO | Attending: Emergency Medicine | Admitting: Emergency Medicine

## 2020-04-13 DIAGNOSIS — S50861A Insect bite (nonvenomous) of right forearm, initial encounter: Secondary | ICD-10-CM | POA: Diagnosis not present

## 2020-04-13 DIAGNOSIS — W57XXXA Bitten or stung by nonvenomous insect and other nonvenomous arthropods, initial encounter: Secondary | ICD-10-CM

## 2020-04-13 DIAGNOSIS — L03113 Cellulitis of right upper limb: Secondary | ICD-10-CM | POA: Diagnosis not present

## 2020-04-13 MED ORDER — DOXYCYCLINE HYCLATE 100 MG PO CAPS: 100.0000 mg | ORAL_CAPSULE | Freq: Two times a day (BID) | ORAL | 0 refills | Status: AC

## 2020-04-13 NOTE — ED Triage Notes (Signed)
Patient c/o bite to his right elbow x 2 days ago.

## 2020-04-13 NOTE — ED Provider Notes (Signed)
[facility]    CSN: [id number] Arrival date & time: 04/13/20  1403      History   Chief Complaint Chief Complaint  Patient presents with   Insect Bite    HPI [name] is a 29 y.o. male.   28 year old male here for evaluation of a bite to his right forearm that he said for 2 days.  Patient states that he was fine when he went to bed 3 nights ago woke up in the morning 2 days ago and he had redness, swelling and a bite on his arm.  Patient endorses body aches, joint pain, headache, and muscle aches.  Patient denies fever.  Patient did not see what bit him.  Patient does work outside.  He does have a cat but it is an Art therapist.     History reviewed. No pertinent past medical history.  There are no problems to display for this patient.   Past Surgical History:  Procedure Laterality Date   FRACTURE SURGERY         Home Medications    Prior to Admission medications   Medication Sig Start Date End Date Taking? Authorizing Provider  doxycycline (VIBRAMYCIN) 100 MG capsule Take 1 capsule (100 mg total) by mouth 2 (two) times daily for 14 days. 04/13/20 04/27/20  Becky Augusta, NP    Family History Family History  Problem Relation Age of Onset   Healthy Mother    Healthy Father     Social History Social History   Tobacco Use   Smoking status: Current Every Day Smoker    Types: Cigarettes   Smokeless tobacco: Never Used  Building services engineer Use: Never used  Substance Use Topics   Alcohol use: Yes   Drug use: Not Currently     Allergies   Patient has no known allergies.   Review of Systems Review of Systems  Constitutional: Negative for activity change, appetite change and fever.  Respiratory: Negative for cough and shortness of breath.   Cardiovascular: Negative for chest pain.  Gastrointestinal: Negative for diarrhea.  Musculoskeletal: Positive for arthralgias and myalgias. Negative for joint swelling.  Skin:  Positive for color change and wound.       With a single bite mark in the center on the right forearm.  Neurological: Positive for headaches.  Hematological: Negative.   Psychiatric/Behavioral: Negative.      Physical Exam Triage Vital Signs ED Triage Vitals  Enc Vitals Group     BP 04/13/20 1445 (!) 98/57     Pulse Rate 04/13/20 1445 (!) 55     Resp 04/13/20 1445 18     Temp 04/13/20 1445 98.4 F (36.9 C)     Temp Source 04/13/20 1445 Oral     SpO2 04/13/20 1445 100 %     Weight 04/13/20 1443 125 lb (56.7 kg)     Height 04/13/20 1443 5\' 8"  (1.727 m)     Head Circumference --      Peak Flow --      Pain Score 04/13/20 1443 3     Pain Loc --      Pain Edu? --      Excl. in [place]? --    No data found.  Updated Vital Signs BP (!) 98/57 (BP Location: Right Arm)    Pulse (!) 55    Temp 98.4 F (36.9 C) (Oral)    Resp 18    Ht 5\' 8"  (1.727 m)  Wt 125 lb (56.7 kg)    SpO2 100%    BMI 19.01 kg/m   Visual Acuity Right Eye Distance:   Left Eye Distance:   Bilateral Distance:    Right Eye Near:   Left Eye Near:    Bilateral Near:     Physical Exam Vitals and nursing note reviewed.  Constitutional:      General: He is not in acute distress.    Appearance: Normal appearance. He is not toxic-appearing.  HENT:     Head: Normocephalic and atraumatic.  Eyes:     General: No scleral icterus.    Extraocular Movements: Extraocular movements intact.     Conjunctiva/sclera: Conjunctivae normal.     Pupils: Pupils are equal, round, and reactive to light.  Cardiovascular:     Rate and Rhythm: Normal rate and regular rhythm.     Pulses: Normal pulses.     Heart sounds: Normal heart sounds. No murmur heard.  No gallop.   Pulmonary:     Effort: Pulmonary effort is normal.     Breath sounds: Normal breath sounds. No wheezing, rhonchi or rales.  Musculoskeletal:        General: No swelling or tenderness. Normal range of motion.     Cervical back: Normal range of motion and neck  supple.     Comments: Patient has a single dark bite mark on the proximal and dorsal aspect of his right forearm.  There is surrounding blanchable erythema approximately the size of a silver dollar around the dark mark.  Area suspicious for tick bite.  None no tick present and no jaw parks visualized in the wound.  Lymphadenopathy:     Cervical: No cervical adenopathy.  Skin:    General: Skin is warm and dry.     Capillary Refill: Capillary refill takes less than 2 seconds.     Findings: Erythema and lesion present. No rash.  Neurological:     General: No focal deficit present.     Mental Status: He is alert and oriented to person, place, and time.  Psychiatric:        Mood and Affect: Mood normal.        Behavior: Behavior normal.        Thought Content: Thought content normal.        Judgment: Judgment normal.      UC Treatments / Results  Labs (all labs ordered are listed, but only abnormal results are displayed) Labs Reviewed - No data to display  EKG   Radiology No results found.  Procedures Procedures (including critical care time)  Medications Ordered in UC Medications - No data to display  Initial Impression / Assessment and Plan / UC Course  I have reviewed the triage vital signs and the nursing notes.  Pertinent labs & imaging results that were available during my care of the patient were reviewed by me and considered in my medical decision making (see chart for details).   Patient here for evaluation of what he thinks is a spider bite to his right forearm.  Patient has an area of redness that is approximately the size of a silver dollar with a dark bite mark in the center.  Area inspected with magnification and there are no jaw parts in the presumed bite mark.  Area is suspicious for tick bite.  Patient has had associated body aches, joint pain, muscle pain, and headache.  Will cover with doxycycline twice daily x14 days.  If symptoms continue patient  can  follow-up with his primary care doctor and have titers drawn.   Final Clinical Impressions(s) / UC Diagnoses   Final diagnoses:  Cellulitis of right upper extremity  Tick bite of right forearm, initial encounter     Discharge Instructions     Take the doxycycline twice daily for 14 days.  Take it with food.  You can use over-the-counter ibuprofen as needed for body aches and muscle aches.  If your symptoms continue beyond the 14 days, you start running a fever, or you develop a rash return for reevaluation.    ED Prescriptions    Medication Sig Dispense Auth. Provider   doxycycline (VIBRAMYCIN) 100 MG capsule Take 1 capsule (100 mg total) by mouth 2 (two) times daily for 14 days. 28 capsule Becky Augusta, NP     PDMP not reviewed this encounter.   Becky Augusta, NP 04/13/20 1502

## 2020-04-13 NOTE — Discharge Instructions (Addendum)
Take the doxycycline twice daily for 14 days.  Take it with food.  You can use over-the-counter ibuprofen as needed for body aches and muscle aches.  If your symptoms continue beyond the 14 days, you start running a fever, or you develop a rash return for reevaluation.

## 2020-05-25 ENCOUNTER — Encounter: Payer: Self-pay | Admitting: Family Medicine

## 2020-05-25 ENCOUNTER — Ambulatory Visit: Payer: BC Managed Care – PPO | Admitting: Family Medicine

## 2020-05-25 ENCOUNTER — Other Ambulatory Visit: Payer: Self-pay

## 2020-05-25 VITALS — BP 102/62 | HR 60 | Ht 68.0 in | Wt 131.0 lb

## 2020-05-25 DIAGNOSIS — Z7689 Persons encountering health services in other specified circumstances: Secondary | ICD-10-CM | POA: Diagnosis not present

## 2020-05-25 DIAGNOSIS — Z6820 Body mass index (BMI) 20.0-20.9, adult: Secondary | ICD-10-CM | POA: Diagnosis not present

## 2020-05-25 NOTE — Progress Notes (Signed)
Date:  05/25/2020   Name:  Kurt Santos   DOB:  1990/07/09   MRN:  [id number]   Chief Complaint: Establish Care  Patient is a 29 year old male who presents for a establish care exam. The patient reports the following problems: inability gain weight. Health maintenance has been reviewed up to date.   Lab Results  Component Value Date   CREATININE 1.24 12/03/2019   BUN 12 12/03/2019   NA 138 12/03/2019   K 3.9 12/03/2019   CL 107 12/03/2019   CO2 25 12/03/2019   No results found for: CHOL, HDL, LDLCALC, LDLDIRECT, TRIG, CHOLHDL No results found for: TSH No results found for: HGBA1C Lab Results  Component Value Date   WBC 12.7 (H) 12/03/2019   HGB 13.6 12/03/2019   HCT 39.6 12/03/2019   MCV 89.6 12/03/2019   PLT 202 12/03/2019   Lab Results  Component Value Date   ALT 23 12/03/2019   AST 25 12/03/2019   ALKPHOS 57 12/03/2019   BILITOT 1.0 12/03/2019     Review of Systems  Constitutional: Negative for chills and fever.  HENT: Negative for drooling, ear discharge, ear pain and sore throat.   Respiratory: Negative for cough, shortness of breath and wheezing.   Cardiovascular: Negative for chest pain, palpitations and leg swelling.  Gastrointestinal: Negative for abdominal pain, blood in stool, constipation, diarrhea and nausea.  Endocrine: Negative for polydipsia.  Genitourinary: Negative for dysuria, frequency, hematuria and urgency.  Musculoskeletal: Negative for back pain, myalgias and neck pain.  Skin: Negative for rash.  Allergic/Immunologic: Negative for environmental allergies.  Neurological: Negative for dizziness and headaches.  Hematological: Does not bruise/bleed easily.  Psychiatric/Behavioral: Negative for suicidal ideas. The patient is not nervous/anxious.     There are no problems to display for this patient.   No Known Allergies  Past Surgical History:  Procedure Laterality Date  . FRACTURE SURGERY     plate and screws in arm  . KNEE  SURGERY Left     Social History   Tobacco Use  . Smoking status: Current Every Day Smoker    Types: Cigarettes  . Smokeless tobacco: Never Used  Vaping Use  . Vaping Use: Never used  Substance Use Topics  . Alcohol use: Yes  . Drug use: Yes    Types: Marijuana     Medication list has been reviewed and updated.  No outpatient medications have been marked as taking for the 05/25/20 encounter (Office Visit) with Duanne Limerick, MD.    [facility] 2/9 Scores 05/25/2020  PHQ - 2 Score 0  PHQ- 9 Score 1    GAD 7 : Generalized Anxiety Score 05/25/2020  Nervous, Anxious, on Edge 2  Control/stop worrying 1  Worry too much - different things 2  Trouble relaxing 2  Restless 0  Easily annoyed or irritable 1  Afraid - awful might happen 1  Total GAD 7 Score 9  Anxiety Difficulty Not difficult at all    BP Readings from Last 3 Encounters:  05/25/20 102/62  04/13/20 (!) 98/57  12/07/19 (!) 103/59    Physical Exam Vitals and nursing note reviewed.  HENT:     Head: Normocephalic.     Right Ear: Tympanic membrane, ear canal and external ear normal.     Left Ear: Tympanic membrane, ear canal and external ear normal.     Nose: Nose normal. No congestion or rhinorrhea.     Mouth/Throat:     Mouth:  Oropharynx is clear and moist.  Eyes:     General: No scleral icterus.       Right eye: No discharge.        Left eye: No discharge.     Extraocular Movements: EOM normal.     Conjunctiva/sclera: Conjunctivae normal.     Pupils: Pupils are equal, round, and reactive to light.  Neck:     Thyroid: No thyromegaly.     Vascular: No JVD.     Trachea: No tracheal deviation.  Cardiovascular:     Rate and Rhythm: Normal rate and regular rhythm.     Pulses: Intact distal pulses.     Heart sounds: Normal heart sounds. No murmur heard. No friction rub. No gallop.   Pulmonary:     Effort: No respiratory distress.     Breath sounds: Normal breath sounds. No wheezing, rhonchi or rales.  Chest:      Chest wall: No tenderness.  Abdominal:     General: Bowel sounds are normal.     Palpations: Abdomen is soft. There is no hepatosplenomegaly or mass.     Tenderness: There is no abdominal tenderness. There is no CVA tenderness, guarding or rebound.  Musculoskeletal:        General: No tenderness or edema. Normal range of motion.     Cervical back: Normal range of motion and neck supple.  Lymphadenopathy:     Cervical: No cervical adenopathy.  Skin:    General: Skin is warm.     Findings: No bruising, erythema or rash.  Neurological:     Mental Status: He is alert and oriented to person, place, and time.     Cranial Nerves: No cranial nerve deficit.     Deep Tendon Reflexes: Strength normal and reflexes are normal and symmetric.     Wt Readings from Last 3 Encounters:  05/25/20 131 lb (59.4 kg)  04/13/20 125 lb (56.7 kg)  12/07/19 128 lb 1.4 oz (58.1 kg)    BP 102/62   Pulse 60   Ht 5\' 8"  (1.727 m)   Wt 131 lb (59.4 kg)   BMI 19.92 kg/m   Assessment and Plan: 1. Establishing care with new doctor, encounter for Patient establishing care with new physician.  Patient's chart was reviewed for previous encounters, review of weight, most recent labs, most recent imaging, and care everywhere.  Patient is to return and we will do a physical exam and 46 weeks.  2. BMI 20.0-20.9, adult Patient has a BMI of 19 and has a history of inability to gain weight with weight fluctuating over the past 2 years between 130 and 126.  Patient has been given a high-protein high caloric diet to be used initially.  We will be seeing patient in 4 to 6 weeks for complete physical at which time we will do lab work.

## 2020-05-25 NOTE — Patient Instructions (Signed)

## 2020-06-28 ENCOUNTER — Encounter: Payer: BC Managed Care – PPO | Admitting: Family Medicine

## 2022-04-12 ENCOUNTER — Other Ambulatory Visit: Payer: Self-pay | Admitting: Orthopedic Surgery

## 2022-04-12 DIAGNOSIS — M25562 Pain in left knee: Secondary | ICD-10-CM

## 2022-04-12 DIAGNOSIS — Z9889 Other specified postprocedural states: Secondary | ICD-10-CM

## 2022-04-12 DIAGNOSIS — M2352 Chronic instability of knee, left knee: Secondary | ICD-10-CM

## 2022-05-07 ENCOUNTER — Other Ambulatory Visit: Payer: BC Managed Care – PPO

## 2022-06-03 ENCOUNTER — Ambulatory Visit
Admission: EM | Admit: 2022-06-03 | Discharge: 2022-06-03 | Disposition: A | Discharge status: Home / Self Care | Payer: 59 | Attending: Internal Medicine | Admitting: Internal Medicine

## 2022-06-03 DIAGNOSIS — R591 Generalized enlarged lymph nodes: Secondary | ICD-10-CM

## 2022-06-03 DIAGNOSIS — Z20822 Contact with and (suspected) exposure to covid-19: Secondary | ICD-10-CM | POA: Diagnosis present

## 2022-06-03 DIAGNOSIS — R6889 Other general symptoms and signs: Secondary | ICD-10-CM

## 2022-06-03 DIAGNOSIS — R197 Diarrhea, unspecified: Secondary | ICD-10-CM | POA: Diagnosis present

## 2022-06-03 DIAGNOSIS — J40 Bronchitis, not specified as acute or chronic: Secondary | ICD-10-CM

## 2022-06-03 LAB — RESP PANEL BY RT-PCR (FLU A&B, COVID) ARPGX2
Influenza A by PCR: POSITIVE — AB
Influenza B by PCR: NEGATIVE
SARS Coronavirus 2 by RT PCR: NEGATIVE

## 2022-06-03 MED ORDER — ALBUTEROL SULFATE HFA 108 (90 BASE) MCG/ACT IN AERS: 2.0000 | INHALATION_SPRAY | RESPIRATORY_TRACT | 0 refills | Status: DC | PRN

## 2022-06-03 MED ORDER — BENZONATATE 200 MG PO CAPS: 200.0000 mg | ORAL_CAPSULE | Freq: Three times a day (TID) | ORAL | 0 refills | Status: DC | PRN

## 2022-06-03 MED ORDER — DOXYCYCLINE HYCLATE 100 MG PO CAPS: 100.0000 mg | ORAL_CAPSULE | Freq: Two times a day (BID) | ORAL | 0 refills | Status: DC

## 2022-06-03 NOTE — ED Triage Notes (Signed)
Pt c/o cough, "knots" on back of head, vomiting x5days  Pt states that he was better today.  Pt was around his daughter who had covid.   Pt has been taking OTC cough syrup and states that it helps  Pt wants to be evaluated for the knots on the head and states that it hurts to touch.

## 2022-06-03 NOTE — Discharge Instructions (Addendum)
You may continue the Pepto as directed  Wear a mask for 5 days when you return to work

## 2022-06-03 NOTE — ED Provider Notes (Addendum)
[facility]    CSN: [id number] Arrival date & time: 06/03/22  0846      History   Chief Complaint Chief Complaint  Patient presents with   Cough   Cyst    HPI Kurt Santos is a 31 y.o. male who presents with history of body aches, cough, fever up to 101, rhinitis, HA, has been wheezing which started 5 days ago. He quit smoking 4 months ago. Cough is productive with green mucous, and gets clear during the day. Has also had N/V/D x 3 days. Vomited 3-4 per day and up to 6 watery stool per day. Has had poor appetite. His daughter tested positive covid last week. He tested himself on day one, but was negative.   2- has bumps on occipital scalp since last night which were tender. Today this bump feels smaller but still tender.     History reviewed. No pertinent past medical history.  There are no problems to display for this patient.   Past Surgical History:  Procedure Laterality Date   FRACTURE SURGERY     plate and screws in arm   KNEE SURGERY Left        Home Medications    Prior to Admission medications   Medication Sig Start Date End Date Taking? Authorizing Provider  benzonatate (TESSALON) 200 MG capsule Take 1 capsule (200 mg total) by mouth 3 (three) times daily as needed. 06/03/22  Yes Rodriguez-Southworth, Nettie Elm, PA-C  doxycycline (VIBRAMYCIN) 100 MG capsule Take 1 capsule (100 mg total) by mouth 2 (two) times daily. 06/03/22  Yes Rodriguez-Southworth, Nettie Elm, PA-C  albuterol (VENTOLIN HFA) 108 (90 Base) MCG/ACT inhaler Inhale 2 puffs into the lungs every 4 (four) hours as needed for wheezing or shortness of breath. 06/03/22   Rodriguez-Southworth, Nettie Elm, PA-C    Family History Family History  Problem Relation Age of Onset   Healthy Mother    Healthy Father    Diabetes Sister     Social History Social History   Tobacco Use   Smoking status: Former    Types: Cigarettes    Quit date: 01/29/2022    Years since quitting: 0.3    Smokeless tobacco: Never  Vaping Use   Vaping Use: Never used  Substance Use Topics   Alcohol use: Not Currently    Comment: occasional 1-2 times a year   Drug use: Not Currently    Comment: used to and quit Marijuana 01/2022     Allergies   Patient has no known allergies.   Review of Systems Review of Systems  Constitutional:  Positive for appetite change, chills and fatigue.  HENT:  Positive for congestion, postnasal drip and rhinorrhea. Negative for ear discharge, ear pain and sore throat.   Eyes:  Negative for discharge.  Respiratory:  Positive for cough and wheezing.   Gastrointestinal:  Positive for diarrhea and vomiting. Negative for abdominal pain.  Musculoskeletal:  Positive for myalgias.  Neurological:  Positive for headaches.     Physical Exam Triage Vital Signs ED Triage Vitals  Enc Vitals Group     BP 06/03/22 1110 118/62     Pulse Rate 06/03/22 1110 70     Resp 06/03/22 1110 18     Temp 06/03/22 1110 98.5 F (36.9 C)     Temp Source 06/03/22 1110 Oral     SpO2 06/03/22 1110 100 %     Weight 06/03/22 1109 120 lb (54.4 kg)     Height 06/03/22 1109 5\' 8"  (  1.727 m)     Head Circumference --      Peak Flow --      Pain Score 06/03/22 1108 8     Pain Loc --      Pain Edu? --      Excl. in GC? --    No data found.  Updated Vital Signs BP 118/62 (BP Location: Left Arm)   Pulse 70   Temp 98.5 F (36.9 C) (Oral)   Resp 18   Ht 5\' 8"  (1.727 m)   Wt 120 lb (54.4 kg)   SpO2 100%   BMI 18.25 kg/m   Visual Acuity Right Eye Distance:   Left Eye Distance:   Bilateral Distance:    Right Eye Near:   Left Eye Near:    Bilateral Near:      Physical Exam Constitutional:      General: He is not in acute distress.    Appearance: He is not toxic-appearing.  HENT:     Head: Normocephalic.     Right Ear: Tympanic membrane, ear canal and external ear normal.     Left Ear: Ear canal and external ear normal.     Nose: Nose normal.     Mouth/Throat:      Mouth: Mucous membranes are moist.     Pharynx: Oropharynx is clear.  Eyes:     General: No scleral icterus.    Conjunctiva/sclera: Conjunctivae normal.  Cardiovascular:     Rate and Rhythm: Normal rate and regular rhythm.     Heart sounds: No murmur heard.   Pulmonary:     Effort: Pulmonary effort is normal. No respiratory distress.     Breath sounds: Wheezing present.     Comments: Has auditory wheezing Musculoskeletal:        General: Normal range of motion.     Cervical back: Neck supple.  Lymphadenopathy:     Cervical: No cervical adenopathy. Has one soft and movable pea size node on L occipital region which is tender  Skin:    General: Skin is warm and dry.     Findings: No rash.  Neurological:     Mental Status: He is alert and oriented to person, place, and time.     Gait: Gait normal.  Psychiatric:        Mood and Affect: Mood normal.        Behavior: Behavior normal.        Thought Content: Thought content normal.        Judgment: Judgment normal.    UC Treatments / Results  Labs (all labs ordered are listed, but only abnormal results are displayed) Labs Reviewed  RESP PANEL BY RT-PCR (FLU A&B, COVID) ARPGX2 - Abnormal; Notable for the following components:      Result Value   Influenza A by PCR POSITIVE (*)    All other components within normal limits   Flu B and Covid test are negative EKG   Radiology No results found.  Procedures Procedures (including critical care time)  Medications Ordered in UC Medications - No data to display  Initial Impression / Assessment and Plan / UC Course  I have reviewed the triage vital signs and the nursing notes.  Pertinent labs  results that were available during my care of the patient were reviewed by me and considered in my medical decision making (see chart for details).  Lymphadenitis Influenza A Possible secondary Bronchitis   I placed him on Albuterol inhaler, Tessalon, and Doxy  as noted. I placed him  on Doxy to cover for bacterial bronchitis and lymphadenitis He was explained is too late for Tamiflu.  May return to work if no fever for 24 hours.  Final Clinical Impressions(s) / UC Diagnoses   Final diagnoses:  Flu-like symptoms  Bronchitis  Lymphadenopathy  Diarrhea, unspecified type  Exposure to COVID-19 virus     Discharge Instructions      You may continue the Pepto as directed  Wear a mask for 5 days when you return to work      ED Prescriptions     Medication Sig Dispense Auth. Provider   benzonatate (TESSALON) 200 MG capsule Take 1 capsule (200 mg total) by mouth 3 (three) times daily as needed. 30 capsule Rodriguez-Southworth, Nettie Elm, PA-C   albuterol (VENTOLIN HFA) 108 (90 Base) MCG/ACT inhaler  (Status: Discontinued) Inhale 2 puffs into the lungs every 4 (four) hours as needed for wheezing or shortness of breath. 18 g Rodriguez-Southworth, Nettie Elm, PA-C   doxycycline (VIBRAMYCIN) 100 MG capsule Take 1 capsule (100 mg total) by mouth 2 (two) times daily. 14 capsule Rodriguez-Southworth, Nettie Elm, PA-C   albuterol (VENTOLIN HFA) 108 (90 Base) MCG/ACT inhaler Inhale 2 puffs into the lungs every 4 (four) hours as needed for wheezing or shortness of breath. 18 g Rodriguez-Southworth, Nettie Elm, PA-C      PDMP not reviewed this encounter.   Garey Ham, PA-C 06/03/22 1529    Rodriguez-Southworth, [place], PA-C 06/03/22 1530

## 2022-06-21 ENCOUNTER — Other Ambulatory Visit: Payer: BC Managed Care – PPO

## 2022-06-22 ENCOUNTER — Ambulatory Visit
Admission: RE | Admit: 2022-06-22 | Discharge: 2022-06-22 | Disposition: A | Discharge status: Home / Self Care | Payer: 59 | Source: Ambulatory Visit | Attending: Orthopedic Surgery | Admitting: Orthopedic Surgery

## 2022-06-22 DIAGNOSIS — Z9889 Other specified postprocedural states: Secondary | ICD-10-CM

## 2022-06-22 DIAGNOSIS — M2352 Chronic instability of knee, left knee: Secondary | ICD-10-CM

## 2022-06-22 DIAGNOSIS — M25562 Pain in left knee: Secondary | ICD-10-CM

## 2022-07-02 ENCOUNTER — Other Ambulatory Visit: Payer: Self-pay | Admitting: Orthopedic Surgery

## 2022-07-08 ENCOUNTER — Encounter
Admission: RE | Admit: 2022-07-08 | Discharge: 2022-07-08 | Disposition: A | Discharge status: Home / Self Care | Payer: 59 | Source: Ambulatory Visit | Attending: Orthopedic Surgery | Admitting: Orthopedic Surgery

## 2022-07-08 ENCOUNTER — Other Ambulatory Visit: Payer: Self-pay

## 2022-07-08 HISTORY — DX: Methicillin resistant Staphylococcus aureus infection as the cause of diseases classified elsewhere: B95.62

## 2022-07-08 HISTORY — DX: Gastro-esophageal reflux disease without esophagitis: K21.9

## 2022-07-08 NOTE — Patient Instructions (Addendum)
Your procedure is scheduled on: 07/16/22 Report to [place]. To find out your arrival time please call [phone] between 1PM - 3PM on 07/15/22.  Remember: Instructions that are not followed completely may result in serious medical risk, up to and including death, or upon the discretion of your surgeon and anesthesiologist your surgery may need to be rescheduled.     _X__ 1. Do not eat food after midnight the night before your procedure.                 No gum chewing or hard candies. You may drink clear liquids up to 2 hours                 before you are scheduled to arrive for your surgery- DO not drink clear                 liquids within 2 hours of the start of your surgery.                 Clear Liquids include:  water, apple juice without pulp, clear carbohydrate                 drink such as Clearfast or Gatorade, Black Coffee or Tea (Do not add                 anything to coffee or tea). Diabetics water only  Drink the Ensure 2 hours before arriving for your surgery  __X__2.  On the morning of surgery brush your teeth with toothpaste and water, you                 may rinse your mouth with mouthwash if you wish.  Do not swallow any              toothpaste of mouthwash.     _X__ 3.  No Alcohol for 24 hours before or after surgery.   _X__ 4.  Do Not Smoke or use e-cigarettes For 24 Hours Prior to Your Surgery.                 Do not use any chewable tobacco products for at least 6 hours prior to                 surgery.  ____  5.  Bring all medications with you on the day of surgery if instructed.   __X__  6.  Notify your doctor if there is any change in your medical condition      (cold, fever, infections).     Do not wear jewelry, make-up, hairpins, clips or nail polish. Do not wear lotions, powders, or perfumes. NO DEODORANT Do not shave body hair 48 hours prior to surgery. Men may shave face and neck. Do not bring  valuables to the hospital.    [facility] is not responsible for any belongings or valuables.  Contacts, dentures/partials or body piercings may not be worn into surgery. Bring a case for your contacts, glasses or hearing aids, a denture cup will be supplied. Leave your suitcase in the car. After surgery it may be brought to your room. For patients admitted to the hospital, discharge time is determined by your treatment team.   Patients discharged the day of surgery will need an adult driver. You will not be allowed to drive yourself home, take an uber, lyft or taxi. You may use medical transportation such  as [place] as long as you have an adult with you or waiting to assist you upon your arrival home. You must have an adult over 75 years old in the home with you for the first 24 hours after surgery   Please read over the following fact sheets that you were given:   CHG SOAP, [place]  __X__ Take these medicines the morning of surgery with A SIP OF WATER:    1. NONE  2.   3.   ____ Fleet Enema (as directed)   __X__ Use CHG Soap/SAGE wipes as directed  ____ Use inhalers on the day of surgery  ____ Stop metformin/Janumet/Farxiga 2 days prior to surgery    ____ Take 1/2 of usual insulin dose the night before surgery. No insulin the morning          of surgery.   ____ Stop Blood Thinners Coumadin/Plavix/Xarelto/Pleta/Pradaxa/Eliquis/Effient/Aspirin  on   Or contact your Surgeon, Cardiologist or Medical Doctor regarding  ability to stop your blood thinners  __X__ Stop Anti-inflammatories 7 days before surgery such as Advil, Ibuprofen, Motrin,  BC or Goodies Powder, Naprosyn, Naproxen, Aleve, Aspirin YOU MAY SUBSTITUTE TYLENOL AS NEEDED   __X__ Stop all herbals and supplements, fish oil or vitamins  until after surgery.    ____ Bring C-Pap to the hospital.        Preparing for Surgery with CHLORHEXIDINE GLUCONATE (CHG) Soap  Chlorhexidine Gluconate (CHG)  Soap  o An antiseptic cleaner that kills germs and bonds with the skin to continue killing germs even after washing  o Used for showering the night before surgery and morning of surgery  Before surgery, you can play an important role by reducing the number of germs on your skin.  CHG (Chlorhexidine gluconate) soap is an antiseptic cleanser which kills germs and bonds with the skin to continue killing germs even after washing.  Please do not use if you have an allergy to CHG or antibacterial soaps. If your skin becomes reddened/irritated stop using the CHG.  1. Shower the NIGHT BEFORE SURGERY and the MORNING OF SURGERY with CHG soap.  2. If you choose to wash your hair, wash your hair first as usual with your normal shampoo.  3. After shampooing, rinse your hair and body thoroughly to remove the shampoo.  4. Use CHG as you would any other liquid soap. You can apply CHG directly to the skin and wash gently with a scrungie or a clean washcloth.  5. Apply the CHG soap to your body only from the neck down. Do not use on open wounds or open sores. Avoid contact with your eyes, ears, mouth, and genitals (private parts). Wash face and genitals (private parts) with your normal soap.  6. Wash thoroughly, paying special attention to the area where your surgery will be performed.  7. Thoroughly rinse your body with warm water.  8. Do not shower/wash with your normal soap after using and rinsing off the CHG soap.  9. Pat yourself dry with a clean towel.  10. Wear clean pajamas to bed the night before surgery.  12. Place clean sheets on your bed the night of your first shower and do not sleep with pets.  13. Shower again with the CHG soap on the day of surgery prior to arriving at the hospital.  14. Do not apply any deodorants/lotions/powders.  15. Please wear clean clothes to the hospital.

## 2022-07-16 ENCOUNTER — Other Ambulatory Visit: Payer: Self-pay

## 2022-07-16 ENCOUNTER — Ambulatory Visit: Payer: 59 | Admitting: Anesthesiology

## 2022-07-16 ENCOUNTER — Encounter: Payer: Self-pay | Admitting: Orthopedic Surgery

## 2022-07-16 ENCOUNTER — Encounter
Admission: RE | Disposition: A | Discharge status: Home / Self Care | Payer: Self-pay | Source: Home / Self Care | Attending: Orthopedic Surgery

## 2022-07-16 ENCOUNTER — Ambulatory Visit
Admission: RE | Admit: 2022-07-16 | Discharge: 2022-07-16 | Disposition: A | Discharge status: Home / Self Care | Payer: 59 | Attending: Orthopedic Surgery | Admitting: Orthopedic Surgery

## 2022-07-16 ENCOUNTER — Ambulatory Visit: Payer: 59

## 2022-07-16 DIAGNOSIS — F17200 Nicotine dependence, unspecified, uncomplicated: Secondary | ICD-10-CM | POA: Insufficient documentation

## 2022-07-16 DIAGNOSIS — S83282A Other tear of lateral meniscus, current injury, left knee, initial encounter: Secondary | ICD-10-CM | POA: Insufficient documentation

## 2022-07-16 DIAGNOSIS — M2342 Loose body in knee, left knee: Secondary | ICD-10-CM | POA: Insufficient documentation

## 2022-07-16 DIAGNOSIS — K219 Gastro-esophageal reflux disease without esophagitis: Secondary | ICD-10-CM | POA: Diagnosis not present

## 2022-07-16 DIAGNOSIS — M1712 Unilateral primary osteoarthritis, left knee: Secondary | ICD-10-CM | POA: Diagnosis not present

## 2022-07-16 DIAGNOSIS — S83512A Sprain of anterior cruciate ligament of left knee, initial encounter: Secondary | ICD-10-CM | POA: Insufficient documentation

## 2022-07-16 DIAGNOSIS — S83242A Other tear of medial meniscus, current injury, left knee, initial encounter: Secondary | ICD-10-CM | POA: Diagnosis not present

## 2022-07-16 DIAGNOSIS — Y9339 Activity, other involving climbing, rappelling and jumping off: Secondary | ICD-10-CM | POA: Insufficient documentation

## 2022-07-16 HISTORY — PX: MEDIAL COLLATERAL LIGAMENT REPAIR, KNEE: SHX2019

## 2022-07-16 HISTORY — PX: KNEE ARTHROSCOPY WITH MEDIAL MENISECTOMY: SHX5651

## 2022-07-16 HISTORY — PX: KNEE ARTHROSCOPY WITH ANTERIOR CRUCIATE LIGAMENT (ACL) REPAIR WITH HAMSTRING GRAFT: SHX5645

## 2022-07-16 HISTORY — PX: CHONDROPLASTY: SHX5177

## 2022-07-16 SURGERY — ARTHROSCOPY, KNEE, WITH MEDIAL MENISCECTOMY
Anesthesia: General | Site: Knee | Laterality: Left

## 2022-07-16 MED ORDER — FAMOTIDINE 20 MG PO TABS: 20.0000 mg | ORAL_TABLET | Freq: Once | ORAL | Status: AC

## 2022-07-16 MED ORDER — VANCOMYCIN HCL 1000 MG IV SOLR
INTRAVENOUS | Status: AC
  Filled 2022-07-16: qty 20

## 2022-07-16 MED ORDER — ONDANSETRON HCL 4 MG/2ML IJ SOLN
INTRAMUSCULAR | Status: AC
  Filled 2022-07-16: qty 2

## 2022-07-16 MED ORDER — FENTANYL CITRATE (PF) 100 MCG/2ML IJ SOLN
INTRAMUSCULAR | Status: AC
  Administered 2022-07-16: 50 ug via INTRAVENOUS
  Filled 2022-07-16: qty 2

## 2022-07-16 MED ORDER — DEXAMETHASONE SODIUM PHOSPHATE 10 MG/ML IJ SOLN
INTRAMUSCULAR | Status: DC | PRN
  Administered 2022-07-16: 10 mg via INTRAVENOUS

## 2022-07-16 MED ORDER — SODIUM CHLORIDE 0.9 % IV SOLN
INTRAVENOUS | Status: AC | PRN
  Administered 2022-07-16: 200 mL via INTRAMUSCULAR

## 2022-07-16 MED ORDER — ONDANSETRON 4 MG PO TBDP: 4.0000 mg | ORAL_TABLET | Freq: Three times a day (TID) | ORAL | 0 refills | Status: AC | PRN

## 2022-07-16 MED ORDER — ACETAMINOPHEN 500 MG PO TABS: 1000.0000 mg | ORAL_TABLET | Freq: Three times a day (TID) | ORAL | 2 refills | Status: AC

## 2022-07-16 MED ORDER — LACTATED RINGERS IV SOLN: INTRAVENOUS | Status: DC

## 2022-07-16 MED ORDER — FENTANYL CITRATE (PF) 100 MCG/2ML IJ SOLN
25.0000 ug | INTRAMUSCULAR | Status: DC | PRN
  Administered 2022-07-16: 50 ug via INTRAVENOUS

## 2022-07-16 MED ORDER — TRANEXAMIC ACID 1000 MG/10ML IV SOLN
INTRAVENOUS | Status: AC
  Filled 2022-07-16: qty 10

## 2022-07-16 MED ORDER — IBUPROFEN 800 MG PO TABS: 800.0000 mg | ORAL_TABLET | Freq: Three times a day (TID) | ORAL | 0 refills | Status: AC

## 2022-07-16 MED ORDER — CEFAZOLIN SODIUM-DEXTROSE 2-4 GM/100ML-% IV SOLN
INTRAVENOUS | Status: AC
  Filled 2022-07-16: qty 100

## 2022-07-16 MED ORDER — ROCURONIUM BROMIDE 100 MG/10ML IV SOLN
INTRAVENOUS | Status: DC | PRN
  Administered 2022-07-16: 50 mg via INTRAVENOUS
  Administered 2022-07-16: 10 mg via INTRAVENOUS

## 2022-07-16 MED ORDER — EPINEPHRINE PF 1 MG/ML IJ SOLN
INTRAMUSCULAR | Status: AC
  Filled 2022-07-16: qty 4

## 2022-07-16 MED ORDER — MIDAZOLAM HCL 2 MG/2ML IJ SOLN
INTRAMUSCULAR | Status: DC | PRN
  Administered 2022-07-16: 2 mg via INTRAVENOUS

## 2022-07-16 MED ORDER — FENTANYL CITRATE (PF) 100 MCG/2ML IJ SOLN
INTRAMUSCULAR | Status: AC
  Filled 2022-07-16: qty 2

## 2022-07-16 MED ORDER — PROPOFOL 10 MG/ML IV BOLUS
INTRAVENOUS | Status: AC
  Filled 2022-07-16: qty 40

## 2022-07-16 MED ORDER — SUGAMMADEX SODIUM 200 MG/2ML IV SOLN
INTRAVENOUS | Status: DC | PRN
  Administered 2022-07-16: 200 mg via INTRAVENOUS

## 2022-07-16 MED ORDER — OXYCODONE HCL 5 MG/5ML PO SOLN: 5.0000 mg | Freq: Once | ORAL | Status: DC | PRN

## 2022-07-16 MED ORDER — ASPIRIN 325 MG PO TBEC: 325.0000 mg | DELAYED_RELEASE_TABLET | Freq: Every day | ORAL | 0 refills | Status: AC

## 2022-07-16 MED ORDER — EPHEDRINE SULFATE (PRESSORS) 50 MG/ML IJ SOLN
INTRAMUSCULAR | Status: DC | PRN
  Administered 2022-07-16: 5 mg via INTRAVENOUS

## 2022-07-16 MED ORDER — CHLORHEXIDINE GLUCONATE 0.12 % MT SOLN: 15.0000 mL | Freq: Once | OROMUCOSAL | Status: AC

## 2022-07-16 MED ORDER — ORAL CARE MOUTH RINSE: 15.0000 mL | Freq: Once | OROMUCOSAL | Status: AC

## 2022-07-16 MED ORDER — BUPIVACAINE HCL (PF) 0.5 % IJ SOLN
INTRAMUSCULAR | Status: AC
  Filled 2022-07-16: qty 30

## 2022-07-16 MED ORDER — TRANEXAMIC ACID-NACL 1000-0.7 MG/100ML-% IV SOLN
INTRAVENOUS | Status: DC | PRN
  Administered 2022-07-16: 1000 mg via INTRAVENOUS

## 2022-07-16 MED ORDER — OXYCODONE HCL 5 MG PO TABS: 5.0000 mg | ORAL_TABLET | Freq: Once | ORAL | Status: DC | PRN

## 2022-07-16 MED ORDER — CEFAZOLIN SODIUM-DEXTROSE 2-4 GM/100ML-% IV SOLN
2.0000 g | INTRAVENOUS | Status: AC
  Administered 2022-07-16: 2 g via INTRAVENOUS

## 2022-07-16 MED ORDER — ROPIVACAINE HCL 5 MG/ML IJ SOLN
INTRAMUSCULAR | Status: DC | PRN
  Administered 2022-07-16: 10 mL via PERINEURAL

## 2022-07-16 MED ORDER — BUPIVACAINE LIPOSOME 1.3 % IJ SUSP
INTRAMUSCULAR | Status: AC
  Filled 2022-07-16: qty 20

## 2022-07-16 MED ORDER — KETAMINE HCL 10 MG/ML IJ SOLN
INTRAMUSCULAR | Status: DC | PRN
  Administered 2022-07-16: 50 mg via INTRAVENOUS

## 2022-07-16 MED ORDER — OXYCODONE HCL 5 MG PO TABS: 5.0000 mg | ORAL_TABLET | ORAL | 0 refills | Status: AC | PRN

## 2022-07-16 MED ORDER — LIDOCAINE-EPINEPHRINE 1 %-1:100000 IJ SOLN
INTRAMUSCULAR | Status: AC
  Filled 2022-07-16: qty 1

## 2022-07-16 MED ORDER — ROPIVACAINE HCL 5 MG/ML IJ SOLN
INTRAMUSCULAR | Status: AC
  Filled 2022-07-16: qty 30

## 2022-07-16 MED ORDER — ONDANSETRON HCL 4 MG/2ML IJ SOLN
INTRAMUSCULAR | Status: DC | PRN
  Administered 2022-07-16: 4 mg via INTRAVENOUS

## 2022-07-16 MED ORDER — GABAPENTIN 300 MG PO CAPS: 300.0000 mg | ORAL_CAPSULE | Freq: Three times a day (TID) | ORAL | 0 refills | Status: AC

## 2022-07-16 MED ORDER — CHLORHEXIDINE GLUCONATE 0.12 % MT SOLN
OROMUCOSAL | Status: AC
  Administered 2022-07-16: 15 mL via OROMUCOSAL
  Filled 2022-07-16: qty 15

## 2022-07-16 MED ORDER — DEXAMETHASONE SODIUM PHOSPHATE 10 MG/ML IJ SOLN
INTRAMUSCULAR | Status: AC
  Filled 2022-07-16: qty 1

## 2022-07-16 MED ORDER — KETAMINE HCL 50 MG/5ML IJ SOSY
PREFILLED_SYRINGE | INTRAMUSCULAR | Status: AC
  Filled 2022-07-16: qty 5

## 2022-07-16 MED ORDER — ROCURONIUM BROMIDE 10 MG/ML (PF) SYRINGE
PREFILLED_SYRINGE | INTRAVENOUS | Status: AC
  Filled 2022-07-16: qty 10

## 2022-07-16 MED ORDER — PROPOFOL 10 MG/ML IV BOLUS
INTRAVENOUS | Status: DC | PRN
  Administered 2022-07-16: 180 mg via INTRAVENOUS

## 2022-07-16 MED ORDER — EPHEDRINE 5 MG/ML INJ
INTRAVENOUS | Status: AC
  Filled 2022-07-16: qty 5

## 2022-07-16 MED ORDER — BUPIVACAINE HCL (PF) 0.5 % IJ SOLN
INTRAMUSCULAR | Status: DC | PRN
  Administered 2022-07-16: 10 mL

## 2022-07-16 MED ORDER — LIDOCAINE HCL (PF) 2 % IJ SOLN
INTRAMUSCULAR | Status: AC
  Filled 2022-07-16: qty 5

## 2022-07-16 MED ORDER — MIDAZOLAM HCL 2 MG/2ML IJ SOLN
INTRAMUSCULAR | Status: AC
  Filled 2022-07-16: qty 2

## 2022-07-16 MED ORDER — DIAZEPAM 5 MG PO TABS: 5.0000 mg | ORAL_TABLET | Freq: Three times a day (TID) | ORAL | 0 refills | Status: AC | PRN

## 2022-07-16 MED ORDER — FENTANYL CITRATE (PF) 100 MCG/2ML IJ SOLN
INTRAMUSCULAR | Status: DC | PRN
  Administered 2022-07-16 (×3): 50 ug via INTRAVENOUS

## 2022-07-16 MED ORDER — 0.9 % SODIUM CHLORIDE (POUR BTL) OPTIME
TOPICAL | Status: DC | PRN
  Administered 2022-07-16: 500 mL

## 2022-07-16 MED ORDER — LIDOCAINE HCL (CARDIAC) PF 100 MG/5ML IV SOSY
PREFILLED_SYRINGE | INTRAVENOUS | Status: DC | PRN
  Administered 2022-07-16: 60 mg via INTRAVENOUS

## 2022-07-16 MED ORDER — BUPIVACAINE LIPOSOME 1.3 % IJ SUSP
INTRAMUSCULAR | Status: DC | PRN
  Administered 2022-07-16: 20 mL

## 2022-07-16 MED ORDER — VANCOMYCIN HCL 1000 MG IV SOLR
INTRAVENOUS | Status: DC | PRN
  Administered 2022-07-16: 1000 mg

## 2022-07-16 MED ORDER — LACTATED RINGERS IV SOLN
INTRAVENOUS | Status: DC | PRN
  Administered 2022-07-16 (×4): 3001 mL

## 2022-07-16 MED ORDER — FAMOTIDINE 20 MG PO TABS
ORAL_TABLET | ORAL | Status: AC
  Administered 2022-07-16: 20 mg via ORAL
  Filled 2022-07-16: qty 1

## 2022-07-16 MED ORDER — LACTATED RINGERS IR SOLN
Status: DC | PRN
  Administered 2022-07-16 (×6): 3000 mL

## 2022-07-16 SURGICAL SUPPLY — 117 items
ADAPTER IRRIG TUBE 2 SPIKE SOL (ADAPTER) ×2 IMPLANT
ADH SKN CLS APL DERMABOND .7 (GAUZE/BANDAGES/DRESSINGS) ×1
ADPR TBG 2 SPK PMP STRL ASCP (ADAPTER) ×2
ANCHOR BUTTON TIGHTROPE 14 (Anchor) IMPLANT
APL PRP STRL LF DISP 70% ISPRP (MISCELLANEOUS) ×2
BLADE FULL RADIUS 3.5 (BLADE) IMPLANT
BLADE SHAVER 4.5X7 STR FR (MISCELLANEOUS) ×1 IMPLANT
BLADE SURG 15 STRL LF DISP TIS (BLADE) ×3 IMPLANT
BLADE SURG 15 STRL SS (BLADE) ×3
BLADE SURG SZ10 CARB STEEL (BLADE) ×1 IMPLANT
BLADE SURG SZ11 CARB STEEL (BLADE) ×1 IMPLANT
BNDG CMPR 5X4 CHSV STRCH STRL (GAUZE/BANDAGES/DRESSINGS) ×1
BNDG CMPR 5X6 CHSV STRCH STRL (GAUZE/BANDAGES/DRESSINGS) ×1
BNDG COHESIVE 4X5 TAN STRL LF (GAUZE/BANDAGES/DRESSINGS) ×1 IMPLANT
BNDG COHESIVE 6X5 TAN ST LF (GAUZE/BANDAGES/DRESSINGS) ×1 IMPLANT
BNDG ELASTIC 6X5.8 VLCR STR LF (GAUZE/BANDAGES/DRESSINGS) ×1 IMPLANT
BNDG ESMARCH 6 X 12 STRL LF (GAUZE/BANDAGES/DRESSINGS) ×1
BNDG ESMARCH 6X12 STRL LF (GAUZE/BANDAGES/DRESSINGS) ×1 IMPLANT
BRUSH SCRUB EZ  4% CHG (MISCELLANEOUS) ×1
BRUSH SCRUB EZ 4% CHG (MISCELLANEOUS) ×1 IMPLANT
BUR BR 5.5 WIDE MOUTH (BURR) IMPLANT
CARTRIDGE SUT 2-0 NONSTITCH (Anchor) IMPLANT
CHLORAPREP W/TINT 26 (MISCELLANEOUS) ×2 IMPLANT
COOLER POLAR GLACIER W/PUMP (MISCELLANEOUS) ×1 IMPLANT
COVER BACK TABLE REUSABLE LG (DRAPES) ×1 IMPLANT
COVER LIGHT HANDLE STERIS (MISCELLANEOUS) ×2 IMPLANT
CUFF TOURN SGL QUICK 24 (TOURNIQUET CUFF)
CUFF TOURN SGL QUICK 34 (TOURNIQUET CUFF)
CUFF TRNQT CYL 24X4X16.5-23 (TOURNIQUET CUFF) IMPLANT
CUFF TRNQT CYL 34X4.125X (TOURNIQUET CUFF) IMPLANT
CUTTER SUT KNOT PUSHER AIR (CUTTER) IMPLANT
DERMABOND ADVANCED .7 DNX12 (GAUZE/BANDAGES/DRESSINGS) ×1 IMPLANT
DEVICE MENISCAL CVD UP (Anchor) ×4 IMPLANT
DEVICE SUCT BLK HOLE OR FLOOR (MISCELLANEOUS) ×1 IMPLANT
DRAPE 3/4 80X56 (DRAPES) ×1 IMPLANT
DRAPE ARTHRO LIMB 89X125 STRL (DRAPES) ×2 IMPLANT
DRAPE FLUOR MINI C-ARM 54X84 (DRAPES) ×1 IMPLANT
DRAPE IMP U-DRAPE 54X76 (DRAPES) ×1 IMPLANT
DRAPE INCISE IOBAN 66X45 STRL (DRAPES) IMPLANT
DRAPE POUCH INSTRU U-SHP 10X18 (DRAPES) ×1 IMPLANT
DRILL FLIPCUTTER III 6-12 (ORTHOPEDIC DISPOSABLE SUPPLIES) IMPLANT
ELECT REM PT RETURN 9FT ADLT (ELECTROSURGICAL) ×1
ELECTRODE REM PT RTRN 9FT ADLT (ELECTROSURGICAL) ×1 IMPLANT
FLIPCUTTER III 6-12 AR-1204FF (ORTHOPEDIC DISPOSABLE SUPPLIES) ×1
GAUZE SPONGE 4X4 12PLY STRL (GAUZE/BANDAGES/DRESSINGS) ×1 IMPLANT
GAUZE XEROFORM 1X8 LF (GAUZE/BANDAGES/DRESSINGS) ×1 IMPLANT
GLOVE BIOGEL PI IND STRL 8 (GLOVE) ×2 IMPLANT
GLOVE ORTHO TXT STRL SZ7.5 (GLOVE) ×1 IMPLANT
GLOVE SURG ORTHO 8.0 STRL STRW (GLOVE) ×2 IMPLANT
GLOVE SURG SYN 8.0 (GLOVE) ×1 IMPLANT
GLOVE SURG SYN 8.0 PF PI (GLOVE) ×1 IMPLANT
GOWN STRL REUS W/ TWL LRG LVL3 (GOWN DISPOSABLE) ×1 IMPLANT
GOWN STRL REUS W/ TWL XL LVL3 (GOWN DISPOSABLE) ×2 IMPLANT
GOWN STRL REUS W/TWL LRG LVL3 (GOWN DISPOSABLE) ×1
GOWN STRL REUS W/TWL XL LVL3 (GOWN DISPOSABLE) ×2
GRADUATE 1200CC STRL 31836 (MISCELLANEOUS) ×1 IMPLANT
GUIDEWIRE 1.2MMX18 (WIRE) ×1 IMPLANT
IMP SYS 2ND FIX PEEK 4.75X19.1 (Miscellaneous) ×1 IMPLANT
IMPL SYS 2ND FX PEEK 4.75X19.1 (Miscellaneous) IMPLANT
IMPL TIGHTROP ABS ACL FIBERTG (Orthopedic Implant) IMPLANT
IMPL TIGHTROP FIBERTAG ACL (Orthopedic Implant) IMPLANT
IMPL TIGHTROPE ABS ACL FIBERTG (Orthopedic Implant) ×1 IMPLANT
IMPLANT TIGHTROPE FIBERTAG ACL (Orthopedic Implant) ×1 IMPLANT
IV LACTATED RINGER IRRG 3000ML (IV SOLUTION) ×10
IV LR IRRIG 3000ML ARTHROMATIC (IV SOLUTION) ×6 IMPLANT
KIT TRANSTIBIAL (DISPOSABLE) IMPLANT
KIT TURNOVER KIT A (KITS) ×1 IMPLANT
KNIFE BLADE PARALLEL SZ9 (BLADE) IMPLANT
MANAGER SUT NOVOCUT (CUTTER) IMPLANT
MANIFOLD NEPTUNE II (INSTRUMENTS) ×2 IMPLANT
MAT ABSORB  FLUID 56X50 GRAY (MISCELLANEOUS) ×2
MAT ABSORB FLUID 56X50 GRAY (MISCELLANEOUS) ×2 IMPLANT
NDL SUT 2-0 SCORPION KNEE (NEEDLE) IMPLANT
NEEDLE HYPO 22GX1.5 SAFETY (NEEDLE) ×1 IMPLANT
NEEDLE SUT 2-0 SCORPION KNEE (NEEDLE) ×1 IMPLANT
NOVOSTICH PRO MENISCAL 2-0 (Miscellaneous) ×1 IMPLANT
PACK ARTHROSCOPY KNEE (MISCELLANEOUS) ×1 IMPLANT
PAD ABD DERMACEA PRESS 5X9 (GAUZE/BANDAGES/DRESSINGS) ×2 IMPLANT
PAD WRAPON POLAR KNEE (MISCELLANEOUS) ×1 IMPLANT
PADDING CAST BLEND 4X4 STRL (MISCELLANEOUS) IMPLANT
PADDING CAST BLEND 6X4 STRL (MISCELLANEOUS) IMPLANT
PADDING CAST COTTON 6X4 STRL (CAST SUPPLIES) ×1 IMPLANT
PENCIL SMOKE EVACUATOR (MISCELLANEOUS) ×1 IMPLANT
REAMER LOW PROFILE 10MM (INSTRUMENTS) IMPLANT
SHAVER BLADE BONE CUTTER  5.5 (BLADE)
SHAVER BLADE BONE CUTTER 5.5 (BLADE) IMPLANT
SLEEVE REMOTE CONTROL 5X12 (DRAPES) IMPLANT
SPONGE T-LAP 18X18 ~~LOC~~+RFID (SPONGE) ×3 IMPLANT
SUT 2 FIBERLOOP 20 STRT BLUE (SUTURE) ×1
SUT ETHILON 3-0 FS-10 30 BLK (SUTURE) ×1
SUT FIBERSNARE 2 CLSD LOOP (SUTURE) IMPLANT
SUT FIBERWIRE #2 38 T-5 BLUE (SUTURE) ×1
SUT MNCRL 4-0 (SUTURE)
SUT MNCRL 4-0 27XMFL (SUTURE)
SUT MNCRL AB 4-0 PS2 18 (SUTURE) ×2 IMPLANT
SUT VIC AB 0 CT1 36 (SUTURE) ×1 IMPLANT
SUT VIC AB 2-0 CT2 27 (SUTURE) ×2 IMPLANT
SUT VIC AB 2-0 SH 27 (SUTURE) ×2
SUT VIC AB 2-0 SH 27XBRD (SUTURE) IMPLANT
SUTURE 2 FIBERLOOP 20 STRT BLU (SUTURE) IMPLANT
SUTURE EHLN 3-0 FS-10 30 BLK (SUTURE) ×1 IMPLANT
SUTURE FIBERWR #2 38 T-5 BLUE (SUTURE) ×2 IMPLANT
SUTURE MNCRL 4-0 27XMF (SUTURE) ×1 IMPLANT
SUTURE TAPE FIBERLINK 1.3 LOOP (SUTURE) IMPLANT
SUTURETAPE FIBERLINK 1.3 LOOP (SUTURE) ×1
SYR BULB IRRIG 60ML STRL (SYRINGE) ×1 IMPLANT
SYS ANCHOR SUT W/2-0 BLU CO-BR (Anchor) IMPLANT
SYSTEM NVSTCH PRO MENISCAL 2-0 (Miscellaneous) IMPLANT
TAPE SUT LABRALTAP WHT/BLK (SUTURE) IMPLANT
TOWEL OR 17X26 4PK STRL BLUE (TOWEL DISPOSABLE) ×2 IMPLANT
TRAP FLUID SMOKE EVACUATOR (MISCELLANEOUS) ×1 IMPLANT
TRAY FOLEY SLVR 16FR LF STAT (SET/KITS/TRAYS/PACK) ×1 IMPLANT
TUBING INFLOW SET DBFLO PUMP (TUBING) ×1 IMPLANT
TUBING OUTFLOW SET DBLFO PUMP (TUBING) ×1 IMPLANT
WAND WEREWOLF FLOW 90D (MISCELLANEOUS) IMPLANT
WATER STERILE IRR 500ML POUR (IV SOLUTION) ×1 IMPLANT
WRAPON POLAR PAD KNEE (MISCELLANEOUS) ×1
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
## 2022-07-16 NOTE — H&P (Signed)
Paper H&P to be scanned into permanent record. H&P reviewed. No significant changes noted.  

## 2022-07-16 NOTE — Anesthesia Postprocedure Evaluation (Signed)
Anesthesia Post Note  Patient: Kurt Santos  Procedure(s) Performed: Left ACL reconstruction using quadriceps tendon autograft, lateral and medial meniscus repair, chondroplasty (Left: Knee) Left ACL reconstruction using quadriceps tendon autograft, lateral and medial meniscus repair, chondroplasty (Left: Knee) Left ACL reconstruction using quadriceps tendon autograft, lateral and medial meniscus repair, chondroplasty (Left: Knee) Left ACL reconstruction using quadriceps tendon autograft, lateral and medial meniscus repair, chondroplasty (Left: Knee)  Patient location during evaluation: PACU Anesthesia Type: General Level of consciousness: awake and alert Pain management: pain level controlled Vital Signs Assessment: post-procedure vital signs reviewed and stable Respiratory status: spontaneous breathing, nonlabored ventilation, respiratory function stable and patient connected to nasal cannula oxygen Cardiovascular status: blood pressure returned to baseline and stable Postop Assessment: no apparent nausea or vomiting Anesthetic complications: no   No notable events documented.   Last Vitals:  Vitals:   07/16/22 1230 07/16/22 1244  BP: 125/68 120/78  Pulse: 67 72  Resp: 11   Temp: 36.4 C (!) 38.1 C  SpO2: 96% 100%    Last Pain:  Vitals:   07/16/22 1244  TempSrc: Temporal  PainSc: 0-No pain                 Precious Haws Reuven Braver

## 2022-07-16 NOTE — Anesthesia Preprocedure Evaluation (Addendum)
Anesthesia Evaluation  Patient identified by MRN, date of birth, ID band Patient awake    Reviewed: Allergy & Precautions, NPO status , Patient's Chart, lab work & pertinent test results  History of Anesthesia Complications Negative for: history of anesthetic complications  Airway Mallampati: II  TM Distance: >3 FB Neck ROM: full    Dental  (+) Chipped, Poor Dentition   Pulmonary neg shortness of breath, Current Smoker and Patient abstained from smoking.   Pulmonary exam normal        Cardiovascular Exercise Tolerance: Good (-) angina (-) Past MI and (-) DOE negative cardio ROS Normal cardiovascular exam     Neuro/Psych negative neurological ROS  negative psych ROS   GI/Hepatic Neg liver ROS,GERD  Controlled,,  Endo/Other  negative endocrine ROS    Renal/GU      Musculoskeletal   Abdominal   Peds  Hematology negative hematology ROS (+)   Anesthesia Other Findings Past Medical History: No date: GERD (gastroesophageal reflux disease) No date: MRSA cellulitis  Past Surgical History: No date: FRACTURE SURGERY; Left     Comment:  plate and screws in arm No date: KNEE SURGERY; Left     Comment:  acl/mcl repair  BMI    Body Mass Index: 19.01 kg/m      Reproductive/Obstetrics negative OB ROS                             Anesthesia Physical Anesthesia Plan  ASA: 2  Anesthesia Plan: General ETT   Post-op Pain Management:    Induction: Intravenous  PONV Risk Score and Plan: Ondansetron, Dexamethasone, Midazolam and Treatment may vary due to age or medical condition  Airway Management Planned: Oral ETT  Additional Equipment:   Intra-op Plan:   Post-operative Plan: Extubation in OR  Informed Consent: I have reviewed the patients History and Physical, chart, labs and discussed the procedure including the risks, benefits and alternatives for the proposed anesthesia with the  patient or authorized representative who has indicated his/her understanding and acceptance.     Dental Advisory Given  Plan Discussed with: Anesthesiologist, CRNA and Surgeon  Anesthesia Plan Comments: (Consented for post op PNB PRN  Patient consented for risks of anesthesia including but not limited to:  - adverse reactions to medications - damage to eyes, teeth, lips or other oral mucosa - nerve damage due to positioning  - sore throat or hoarseness - Damage to heart, brain, nerves, lungs, other parts of body or loss of life  Patient voiced understanding.)       Anesthesia Quick Evaluation

## 2022-07-16 NOTE — Transfer of Care (Signed)
Immediate Anesthesia Transfer of Care Note  Patient: Kurt Santos  Procedure(s) Performed: Left ACL reconstruction using quadriceps tendon autograft, lateral and medial meniscus repair, chondroplasty (Left: Knee) Left ACL reconstruction using quadriceps tendon autograft, lateral and medial meniscus repair, chondroplasty (Left: Knee) Left ACL reconstruction using quadriceps tendon autograft, lateral and medial meniscus repair, chondroplasty (Left: Knee) Left ACL reconstruction using quadriceps tendon autograft, lateral and medial meniscus repair, chondroplasty (Left: Knee)  Patient Location: PACU  Anesthesia Type:General  Level of Consciousness: awake and alert   Airway & Oxygen Therapy: Patient Spontanous Breathing and Patient connected to face mask oxygen  Post-op Assessment: Report given to RN and Post -op Vital signs reviewed and stable  Post vital signs: Reviewed and stable  Last Vitals:  Vitals Value Taken Time  BP 112/52 07/16/22 1101  Temp    Pulse 60 07/16/22 1104  Resp 12 07/16/22 1104  SpO2 100 % 07/16/22 1104  Vitals shown include unvalidated device data.  Last Pain:  Vitals:   07/16/22 0626  TempSrc: Oral  PainSc: 0-No pain         Complications: No notable events documented.

## 2022-07-16 NOTE — Discharge Instructions (Addendum)
Arthroscopic ACL Surgery with Meniscus Repair   Post-Op Instructions   1. Bracing or crutches: Crutches will be provided at the time of discharge from the surgery center.    2. Ice: You may be provided with a device [facility]) that allows you to ice the affected area effectively. Otherwise you can ice manually.   3. Driving:  Driving: Off all narcotic pain meds when operating vehicle   1 week for automatic cars, left leg surgery  4 weeks for standard/manual cars or right leg surgery   4. Activity: Ankle pumps several times an hour while awake to prevent blood clots. Weight bearing: foot-flat weight bearing is permitted with brace locked in extension (weight of leg for balance only -- must use arms for support when operative leg is on the ground). The brace should not be unlocked in order to protect the meniscus repair. Unlock only for hygiene and for exercises as directed by physical therapist. Elevate knee above heart level as much as possible for one week. Avoid standing more than 5 minutes (consecutively) for the first week. No exercise involving the knee until cleared by the surgeon or physical therapist. Ideally, you should avoid long distance travel for 4 weeks.   5. Medications:  - You have been provided a prescription for narcotic pain medicine (oxycodone). After surgery, take 1-2 narcotic tablets every 4 hours if needed for severe pain.  - A prescription for anti-nausea medication (Zofran) will be provided in case the narcotic medicine causes nausea - take 1 tablet every 6 hours only if nauseated.  - Take ibuprofen 800 mg every 8 hours with food (scheduled) to reduce post-operative knee swelling. DO NOT STOP IBUPROFEN POST-OP UNTIL INSTRUCTED TO DO SO at first post-op office visit (10-14 days after surgery).  - Take enteric coated aspirin 325 mg once daily for 4 weeks to prevent blood clots.  -Take tylenol 1000 mg every 8 hours for pain (scheduled).  May stop tylenol ~1-2 weeks after  surgery if you are having minimal pain. - Take gabapentin 300 mg three times daily for 5 days (scheduled) - Take Valium 5mg  three times daily as needed x 2 weeks. May stop if not having any significant muscle spasms.  If you are taking prescription medication for anxiety, depression, insomnia, muscle spasm, chronic pain, or for attention deficit disorder you are advised that you are at a higher risk of adverse effects with use of narcotics post-op, including narcotic addiction/dependence, depressed breathing, death. If you use non-prescribed substances: alcohol, marijuana, cocaine, heroin, methamphetamines, etc., you are at a higher risk of adverse effects with use of narcotics post-op, including narcotic addiction/dependence, depressed breathing, death. You are advised that taking > 50 morphine milligram equivalents (MME) of narcotic pain medication per day results in twice the risk of overdose or death. For your prescription provided: oxycodone 5 mg - taking more than 6 tablets per day. Be advised that we will prescribe narcotics short-term, for acute post-operative pain only - 1 week for minor operations such as knee arthroscopy for meniscus tear resection, and 3 weeks for major operations such as knee repair/reconstruction surgeries.   6. Bandages: The physical therapist should change the bandages at the first post-op appointment. If needed, the dressing supplies have been provided to you.   7. Physical Therapy: 2 times per week for the first 4 weeks, then 1-2 times per week from weeks 4-8 post-op. Therapy typically starts within 1 week. You have been provided an order for physical therapy. The therapist  will provide home exercises. Attending physical therapy and performing the appropriate exercises on your own at home daily will determine how well you do after this surgery. If you do not know when your first physical therapy appointment is, please call the office at [phone]. 8. Work/School: May  return when able to tolerate standing for greater than 2 hours and off of narcotic pain medications. Can return to school usually in ~1-2 weeks.    9. Post-Op Appointments: Your first post-op appointment will be with Dr. Posey Pronto in approximately 2 weeks time.    If you find that they have not been scheduled please call the Orthopaedic Appointment front desk at [phone].  AMBULATORY SURGERY  DISCHARGE INSTRUCTIONS   The drugs that you were given will stay in your system until tomorrow so for the next 24 hours you should not:  Drive an automobile Make any legal decisions Drink any alcoholic beverage   You may resume regular meals tomorrow.  Today it is better to start with liquids and gradually work up to solid foods.  You may eat anything you prefer, but it is better to start with liquids, then soup and crackers, and gradually work up to solid foods.   Please notify your doctor immediately if you have any unusual bleeding, trouble breathing, redness and pain at the surgery site, drainage, fever, or pain not relieved by medication.    Additional Instructions:   PLEASE LEAVE GREEN ARMBAND ON FOR 4 DAYS   Please contact your physician with any problems or Same Day Surgery at [phone], Monday through Friday 6 am to 4 pm, or [place] at [facility] number at [phone].

## 2022-07-16 NOTE — Anesthesia Procedure Notes (Signed)
Procedure Name: Intubation Date/Time: 07/16/2022 7:42 AM  Performed by: Fredderick Phenix, CRNAPre-anesthesia Checklist: Patient identified, Emergency Drugs available, Suction available and Patient being monitored Patient Re-evaluated:Patient Re-evaluated prior to induction Oxygen Delivery Method: Circle system utilized Preoxygenation: Pre-oxygenation with 100% oxygen Induction Type: IV induction Ventilation: Mask ventilation without difficulty Laryngoscope Size: Mac and 3 Grade View: Grade I Tube type: Oral Tube size: 7.5 mm Number of attempts: 1 Airway Equipment and Method: Stylet and Oral airway Placement Confirmation: ETT inserted through vocal cords under direct vision, positive ETCO2 and breath sounds checked- equal and bilateral Secured at: 22 cm Tube secured with: Tape Dental Injury: Teeth and Oropharynx as per pre-operative assessment

## 2022-07-16 NOTE — Anesthesia Procedure Notes (Signed)
Anesthesia Regional Block: Adductor canal block   Pre-Anesthetic Checklist: , timeout performed,  Correct Patient, Correct Site, Correct Laterality,  Correct Procedure, Correct Position, site marked,  Risks and benefits discussed,  Surgical consent,  Pre-op evaluation,  At surgeon's request and post-op pain management  Laterality: Lower and Left  Prep: chloraprep       Needles:  Injection technique: Single-shot  Needle Type: Echogenic Needle     Needle Length: 9cm  Needle Gauge: 21     Additional Needles:   Procedures:,,,, ultrasound used (permanent image in chart),,    Narrative:  Start time: 07/16/2022 11:42 AM End time: 07/16/2022 11:44 AM Injection made incrementally with aspirations every 5 mL.  Performed by: Personally  Anesthesiologist: Audrielle Vankuren, Precious Haws, MD  Additional Notes: Patient consented for risk and benefits of nerve block including but not limited to nerve damage, failed block, bleeding and infection.  Patient voiced understanding.  Functioning IV was confirmed and monitors were applied.  Timeout done prior to procedure and prior to any sedation being given to the patient.  Patient confirmed procedure site prior to any sedation given to the patient.  A 65mm 22ga Stimuplex needle was used. Sterile prep,hand hygiene and sterile gloves were used.  Minimal sedation used for procedure.  No paresthesia endorsed by patient during the procedure.  Negative aspiration and negative test dose prior to incremental administration of local anesthetic. The patient tolerated the procedure well with no immediate complications.

## 2022-07-17 ENCOUNTER — Encounter: Payer: Self-pay | Admitting: Orthopedic Surgery
